# Patient Record
Sex: Male | Born: 1940 | Race: Black or African American | Hispanic: No | Smoking: Former smoker
Health system: Southern US, Community
[De-identification: ages and names within clinical notes are randomized; demographics above are authoritative.]

## PROBLEM LIST (undated history)

## (undated) DIAGNOSIS — N400 Enlarged prostate without lower urinary tract symptoms: Secondary | ICD-10-CM

## (undated) DIAGNOSIS — E119 Type 2 diabetes mellitus without complications: Secondary | ICD-10-CM

## (undated) DIAGNOSIS — R569 Unspecified convulsions: Secondary | ICD-10-CM

## (undated) DIAGNOSIS — F209 Schizophrenia, unspecified: Secondary | ICD-10-CM

## (undated) DIAGNOSIS — I639 Cerebral infarction, unspecified: Secondary | ICD-10-CM

## (undated) DIAGNOSIS — F319 Bipolar disorder, unspecified: Secondary | ICD-10-CM

## (undated) DIAGNOSIS — F0281 Dementia in other diseases classified elsewhere with behavioral disturbance: Secondary | ICD-10-CM

## (undated) DIAGNOSIS — H409 Unspecified glaucoma: Secondary | ICD-10-CM

## (undated) DIAGNOSIS — I10 Essential (primary) hypertension: Secondary | ICD-10-CM

## (undated) DIAGNOSIS — E7800 Pure hypercholesterolemia, unspecified: Secondary | ICD-10-CM

## (undated) DIAGNOSIS — F329 Major depressive disorder, single episode, unspecified: Secondary | ICD-10-CM

## (undated) DIAGNOSIS — F419 Anxiety disorder, unspecified: Secondary | ICD-10-CM

## (undated) DIAGNOSIS — G4700 Insomnia, unspecified: Secondary | ICD-10-CM

## (undated) DIAGNOSIS — G309 Alzheimer's disease, unspecified: Secondary | ICD-10-CM

## (undated) DIAGNOSIS — F32A Depression, unspecified: Secondary | ICD-10-CM

## (undated) DIAGNOSIS — F02818 Dementia in other diseases classified elsewhere, unspecified severity, with other behavioral disturbance: Secondary | ICD-10-CM

## (undated) DIAGNOSIS — I440 Atrioventricular block, first degree: Secondary | ICD-10-CM

## (undated) HISTORY — PX: EYE SURGERY: SHX253

## (undated) HISTORY — PX: GLAUCOMA SURGERY: SHX656

## (undated) HISTORY — PX: APPENDECTOMY: SHX54

## (undated) HISTORY — PX: REFRACTIVE SURGERY: SHX103

## (undated) HISTORY — PX: CHOLECYSTECTOMY: SHX55

---

## 2017-03-22 ENCOUNTER — Observation Stay (HOSPITAL_COMMUNITY): Payer: Medicare Other

## 2017-03-22 ENCOUNTER — Other Ambulatory Visit: Payer: Self-pay

## 2017-03-22 ENCOUNTER — Inpatient Hospital Stay (HOSPITAL_COMMUNITY)
Admission: EM | Admit: 2017-03-22 | Discharge: 2017-03-27 | DRG: 100 | Disposition: A | Discharge status: Skilled Nursing Facility | Payer: Medicare Other | Attending: Internal Medicine | Admitting: Internal Medicine

## 2017-03-22 ENCOUNTER — Encounter (HOSPITAL_COMMUNITY): Payer: Self-pay | Admitting: Emergency Medicine

## 2017-03-22 ENCOUNTER — Emergency Department (HOSPITAL_COMMUNITY): Payer: Medicare Other

## 2017-03-22 DIAGNOSIS — G9341 Metabolic encephalopathy: Secondary | ICD-10-CM | POA: Diagnosis present

## 2017-03-22 DIAGNOSIS — G4700 Insomnia, unspecified: Secondary | ICD-10-CM

## 2017-03-22 DIAGNOSIS — H409 Unspecified glaucoma: Secondary | ICD-10-CM

## 2017-03-22 DIAGNOSIS — G9340 Encephalopathy, unspecified: Secondary | ICD-10-CM

## 2017-03-22 DIAGNOSIS — Z5309 Procedure and treatment not carried out because of other contraindication: Secondary | ICD-10-CM

## 2017-03-22 DIAGNOSIS — R4182 Altered mental status, unspecified: Secondary | ICD-10-CM

## 2017-03-22 DIAGNOSIS — G308 Other Alzheimer's disease: Secondary | ICD-10-CM | POA: Diagnosis not present

## 2017-03-22 DIAGNOSIS — F0281 Dementia in other diseases classified elsewhere with behavioral disturbance: Secondary | ICD-10-CM | POA: Diagnosis not present

## 2017-03-22 DIAGNOSIS — F02818 Dementia in other diseases classified elsewhere, unspecified severity, with other behavioral disturbance: Secondary | ICD-10-CM

## 2017-03-22 DIAGNOSIS — G40909 Epilepsy, unspecified, not intractable, without status epilepticus: Secondary | ICD-10-CM | POA: Diagnosis not present

## 2017-03-22 DIAGNOSIS — R410 Disorientation, unspecified: Secondary | ICD-10-CM

## 2017-03-22 DIAGNOSIS — I10 Essential (primary) hypertension: Secondary | ICD-10-CM

## 2017-03-22 DIAGNOSIS — R569 Unspecified convulsions: Secondary | ICD-10-CM

## 2017-03-22 DIAGNOSIS — R404 Transient alteration of awareness: Secondary | ICD-10-CM | POA: Diagnosis not present

## 2017-03-22 DIAGNOSIS — F319 Bipolar disorder, unspecified: Secondary | ICD-10-CM

## 2017-03-22 DIAGNOSIS — E119 Type 2 diabetes mellitus without complications: Secondary | ICD-10-CM

## 2017-03-22 DIAGNOSIS — G309 Alzheimer's disease, unspecified: Secondary | ICD-10-CM

## 2017-03-22 DIAGNOSIS — E785 Hyperlipidemia, unspecified: Secondary | ICD-10-CM

## 2017-03-22 HISTORY — DX: Unspecified glaucoma: H40.9

## 2017-03-22 HISTORY — DX: Atrioventricular block, first degree: I44.0

## 2017-03-22 HISTORY — DX: Essential (primary) hypertension: I10

## 2017-03-22 HISTORY — DX: Schizophrenia, unspecified: F20.9

## 2017-03-22 HISTORY — DX: Type 2 diabetes mellitus without complications: E11.9

## 2017-03-22 HISTORY — DX: Cerebral infarction, unspecified: I63.9

## 2017-03-22 HISTORY — DX: Alzheimer's disease, unspecified: G30.9

## 2017-03-22 HISTORY — DX: Major depressive disorder, single episode, unspecified: F32.9

## 2017-03-22 HISTORY — DX: Pure hypercholesterolemia, unspecified: E78.00

## 2017-03-22 HISTORY — DX: Benign prostatic hyperplasia without lower urinary tract symptoms: N40.0

## 2017-03-22 HISTORY — DX: Dementia in other diseases classified elsewhere, unspecified severity, with other behavioral disturbance: F02.818

## 2017-03-22 HISTORY — DX: Dementia in other diseases classified elsewhere with behavioral disturbance: F02.81

## 2017-03-22 HISTORY — DX: Bipolar disorder, unspecified: F31.9

## 2017-03-22 HISTORY — DX: Unspecified convulsions: R56.9

## 2017-03-22 HISTORY — DX: Anxiety disorder, unspecified: F41.9

## 2017-03-22 HISTORY — DX: Insomnia, unspecified: G47.00

## 2017-03-22 HISTORY — DX: Depression, unspecified: F32.A

## 2017-03-22 LAB — CBC
HEMATOCRIT: 38.5 % — AB (ref 39.0–52.0)
HEMOGLOBIN: 12.4 g/dL — AB (ref 13.0–17.0)
MCH: 27.1 pg (ref 26.0–34.0)
MCHC: 32.2 g/dL (ref 30.0–36.0)
MCV: 84.2 fL (ref 78.0–100.0)
Platelets: 176 10*3/uL (ref 150–400)
RBC: 4.57 MIL/uL (ref 4.22–5.81)
RDW: 14.3 % (ref 11.5–15.5)
WBC: 8.2 10*3/uL (ref 4.0–10.5)

## 2017-03-22 LAB — GLUCOSE, CAPILLARY
GLUCOSE-CAPILLARY: 104 mg/dL — AB (ref 65–99)
Glucose-Capillary: 107 mg/dL — ABNORMAL HIGH (ref 65–99)
Glucose-Capillary: 73 mg/dL (ref 65–99)
Glucose-Capillary: 103 mg/dL — ABNORMAL HIGH (ref 65–99)

## 2017-03-22 LAB — URINALYSIS, ROUTINE W REFLEX MICROSCOPIC
BILIRUBIN URINE: NEGATIVE
Bacteria, UA: NONE SEEN
GLUCOSE, UA: NEGATIVE mg/dL
Hgb urine dipstick: NEGATIVE
KETONES UR: NEGATIVE mg/dL
LEUKOCYTES UA: NEGATIVE
NITRITE: NEGATIVE
PH: 8 (ref 5.0–8.0)
Protein, ur: 100 mg/dL — AB
Specific Gravity, Urine: 1.028 (ref 1.005–1.030)
Squamous Epithelial / LPF: NONE SEEN

## 2017-03-22 LAB — I-STAT CHEM 8, ED
BUN: 20 mg/dL (ref 6–20)
CALCIUM ION: 1.07 mmol/L — AB (ref 1.15–1.40)
CREATININE: 1.3 mg/dL — AB (ref 0.61–1.24)
Chloride: 106 mmol/L (ref 101–111)
GLUCOSE: 195 mg/dL — AB (ref 65–99)
HCT: 39 % (ref 39.0–52.0)
Hemoglobin: 13.3 g/dL (ref 13.0–17.0)
Potassium: 4.3 mmol/L (ref 3.5–5.1)
Sodium: 140 mmol/L (ref 135–145)
TCO2: 19 mmol/L — AB (ref 22–32)

## 2017-03-22 LAB — I-STAT TROPONIN, ED: Troponin i, poc: 0.06 ng/mL (ref 0.00–0.08)

## 2017-03-22 LAB — COMPREHENSIVE METABOLIC PANEL
ALBUMIN: 3.5 g/dL (ref 3.5–5.0)
ALK PHOS: 73 U/L (ref 38–126)
ALT: 19 U/L (ref 17–63)
AST: 34 U/L (ref 15–41)
Anion gap: 16 — ABNORMAL HIGH (ref 5–15)
BUN: 16 mg/dL (ref 6–20)
CALCIUM: 8.7 mg/dL — AB (ref 8.9–10.3)
CO2: 17 mmol/L — ABNORMAL LOW (ref 22–32)
Chloride: 104 mmol/L (ref 101–111)
Creatinine, Ser: 1.45 mg/dL — ABNORMAL HIGH (ref 0.61–1.24)
GFR calc Af Amer: 53 mL/min — ABNORMAL LOW (ref >60)
GFR calc non Af Amer: 46 mL/min — ABNORMAL LOW (ref >60)
GLUCOSE: 195 mg/dL — AB (ref 65–99)
Potassium: 4.2 mmol/L (ref 3.5–5.1)
Sodium: 137 mmol/L (ref 135–145)
TOTAL PROTEIN: 6.3 g/dL — AB (ref 6.5–8.1)

## 2017-03-22 LAB — RAPID URINE DRUG SCREEN, HOSP PERFORMED
Amphetamines: NOT DETECTED
BARBITURATES: NOT DETECTED
Benzodiazepines: POSITIVE — AB
Cocaine: NOT DETECTED
Opiates: NOT DETECTED
TETRAHYDROCANNABINOL: NOT DETECTED

## 2017-03-22 LAB — DIFFERENTIAL
Basophils Absolute: 0 10*3/uL (ref 0.0–0.1)
Basophils Relative: 0 %
Eosinophils Absolute: 0.5 10*3/uL (ref 0.0–0.7)
Eosinophils Relative: 6 %
LYMPHS ABS: 2.2 10*3/uL (ref 0.7–4.0)
LYMPHS PCT: 26 %
MONOS PCT: 10 %
Monocytes Absolute: 0.8 10*3/uL (ref 0.1–1.0)
NEUTROS PCT: 58 %
Neutro Abs: 4.7 10*3/uL (ref 1.7–7.7)

## 2017-03-22 LAB — CBG MONITORING, ED
GLUCOSE-CAPILLARY: 114 mg/dL — AB (ref 65–99)
Glucose-Capillary: 190 mg/dL — ABNORMAL HIGH (ref 65–99)

## 2017-03-22 LAB — ETHANOL: Alcohol, Ethyl (B): <10 mg/dL (ref <10)

## 2017-03-22 LAB — HEMOGLOBIN A1C
HEMOGLOBIN A1C: 6.2 % — AB (ref 4.8–5.6)
MEAN PLASMA GLUCOSE: 131.24 mg/dL

## 2017-03-22 LAB — APTT: aPTT: 28 seconds (ref 24–36)

## 2017-03-22 LAB — PROTIME-INR
INR: 1.08
Prothrombin Time: 13.9 seconds (ref 11.4–15.2)

## 2017-03-22 LAB — MRSA PCR SCREENING: MRSA by PCR: NEGATIVE

## 2017-03-22 LAB — VALPROIC ACID LEVEL: Valproic Acid Lvl: <10 ug/mL — ABNORMAL LOW (ref 50.0–100.0)

## 2017-03-22 MED ORDER — DONEPEZIL HCL 5 MG PO TABS
5.0000 mg | ORAL_TABLET | Freq: Every day | ORAL | Status: DC
  Administered 2017-03-23 – 2017-03-26 (×4): 5 mg via ORAL
  Filled 2017-03-22 (×5): qty 1

## 2017-03-22 MED ORDER — HYDRALAZINE HCL 20 MG/ML IJ SOLN: 5.0000 mg | Freq: Three times a day (TID) | INTRAMUSCULAR | Status: DC | PRN

## 2017-03-22 MED ORDER — BRIMONIDINE TARTRATE 0.2 % OP SOLN: 1.0000 [drp] | Freq: Two times a day (BID) | OPHTHALMIC | Status: DC

## 2017-03-22 MED ORDER — ASPIRIN EC 81 MG PO TBEC
81.0000 mg | DELAYED_RELEASE_TABLET | ORAL | Status: DC
  Administered 2017-03-24 – 2017-03-27 (×4): 81 mg via ORAL
  Filled 2017-03-22 (×5): qty 1

## 2017-03-22 MED ORDER — IOPAMIDOL (ISOVUE-370) INJECTION 76%
INTRAVENOUS | Status: AC
  Filled 2017-03-22: qty 50

## 2017-03-22 MED ORDER — ATORVASTATIN CALCIUM 10 MG PO TABS
20.0000 mg | ORAL_TABLET | Freq: Every day | ORAL | Status: DC
  Administered 2017-03-23 – 2017-03-26 (×4): 20 mg via ORAL
  Filled 2017-03-22 (×4): qty 2

## 2017-03-22 MED ORDER — MIRTAZAPINE 15 MG PO TABS
15.0000 mg | ORAL_TABLET | Freq: Every day | ORAL | Status: DC
  Administered 2017-03-23 – 2017-03-26 (×4): 15 mg via ORAL
  Filled 2017-03-22 (×5): qty 1

## 2017-03-22 MED ORDER — BRIMONIDINE TARTRATE 0.15 % OP SOLN
1.0000 [drp] | Freq: Two times a day (BID) | OPHTHALMIC | Status: DC
  Administered 2017-03-22 – 2017-03-27 (×11): 1 [drp] via OPHTHALMIC
  Filled 2017-03-22: qty 5

## 2017-03-22 MED ORDER — BRINZOLAMIDE 1 % OP SUSP
1.0000 [drp] | Freq: Two times a day (BID) | OPHTHALMIC | Status: DC
  Filled 2017-03-22: qty 10

## 2017-03-22 MED ORDER — INSULIN ASPART 100 UNIT/ML ~~LOC~~ SOLN
0.0000 [IU] | Freq: Three times a day (TID) | SUBCUTANEOUS | Status: DC
  Administered 2017-03-23 – 2017-03-24 (×2): 2 [IU] via SUBCUTANEOUS
  Administered 2017-03-26 – 2017-03-27 (×3): 1 [IU] via SUBCUTANEOUS

## 2017-03-22 MED ORDER — BISACODYL 10 MG RE SUPP: 10.0000 mg | Freq: Every day | RECTAL | Status: DC | PRN

## 2017-03-22 MED ORDER — ACETAMINOPHEN 650 MG RE SUPP: 650.0000 mg | Freq: Four times a day (QID) | RECTAL | Status: DC | PRN

## 2017-03-22 MED ORDER — LEVETIRACETAM IN NACL 1000 MG/100ML IV SOLN
1000.0000 mg | Freq: Once | INTRAVENOUS | Status: AC
  Administered 2017-03-22: 1000 mg via INTRAVENOUS

## 2017-03-22 MED ORDER — LATANOPROST 0.005 % OP SOLN
1.0000 [drp] | Freq: Every day | OPHTHALMIC | Status: DC
  Administered 2017-03-22 – 2017-03-26 (×5): 1 [drp] via OPHTHALMIC
  Filled 2017-03-22: qty 2.5

## 2017-03-22 MED ORDER — HYDRALAZINE HCL 20 MG/ML IJ SOLN
5.0000 mg | Freq: Three times a day (TID) | INTRAMUSCULAR | Status: DC | PRN
  Administered 2017-03-22: 5 mg via INTRAVENOUS
  Filled 2017-03-22: qty 1

## 2017-03-22 MED ORDER — TRAZODONE HCL 50 MG PO TABS
75.0000 mg | ORAL_TABLET | Freq: Every day | ORAL | Status: DC
  Administered 2017-03-23 – 2017-03-26 (×4): 75 mg via ORAL
  Filled 2017-03-22 (×5): qty 2

## 2017-03-22 MED ORDER — FINASTERIDE 5 MG PO TABS
5.0000 mg | ORAL_TABLET | Freq: Every day | ORAL | Status: DC
  Administered 2017-03-23 – 2017-03-27 (×5): 5 mg via ORAL
  Filled 2017-03-22 (×5): qty 1

## 2017-03-22 MED ORDER — LORAZEPAM 2 MG/ML IJ SOLN: 1.0000 mg | INTRAMUSCULAR | Status: DC | PRN

## 2017-03-22 MED ORDER — ARIPIPRAZOLE 10 MG PO TABS
10.0000 mg | ORAL_TABLET | Freq: Every day | ORAL | Status: DC
  Administered 2017-03-23 – 2017-03-27 (×5): 10 mg via ORAL
  Filled 2017-03-22 (×5): qty 1

## 2017-03-22 MED ORDER — ONDANSETRON HCL 4 MG PO TABS: 4.0000 mg | ORAL_TABLET | Freq: Four times a day (QID) | ORAL | Status: DC | PRN

## 2017-03-22 MED ORDER — DEXTROSE 5 % IV SOLN
625.0000 mg | Freq: Two times a day (BID) | INTRAVENOUS | Status: DC
  Administered 2017-03-23 (×3): 625 mg via INTRAVENOUS
  Filled 2017-03-22 (×5): qty 6.25

## 2017-03-22 MED ORDER — SALINE SPRAY 0.65 % NA SOLN
2.0000 | Freq: Every day | NASAL | Status: DC
  Administered 2017-03-23 – 2017-03-27 (×5): 2 via NASAL
  Filled 2017-03-22: qty 44

## 2017-03-22 MED ORDER — IOPAMIDOL (ISOVUE-370) INJECTION 76%
INTRAVENOUS | Status: AC
  Administered 2017-03-22: 100 mL
  Filled 2017-03-22: qty 50

## 2017-03-22 MED ORDER — ASPIRIN 300 MG RE SUPP: 150.0000 mg | Freq: Every day | RECTAL | Status: DC

## 2017-03-22 MED ORDER — PANTOPRAZOLE SODIUM 40 MG IV SOLR: 40.0000 mg | INTRAVENOUS | Status: DC

## 2017-03-22 MED ORDER — ONDANSETRON HCL 4 MG/2ML IJ SOLN: 4.0000 mg | Freq: Four times a day (QID) | INTRAMUSCULAR | Status: DC | PRN

## 2017-03-22 MED ORDER — ACETAMINOPHEN 325 MG PO TABS: 650.0000 mg | ORAL_TABLET | Freq: Four times a day (QID) | ORAL | Status: DC | PRN

## 2017-03-22 MED ORDER — SODIUM CHLORIDE 0.9 % IV SOLN
750.0000 mg | Freq: Two times a day (BID) | INTRAVENOUS | Status: DC
  Administered 2017-03-22 – 2017-03-24 (×4): 750 mg via INTRAVENOUS
  Filled 2017-03-22 (×5): qty 7.5

## 2017-03-22 MED ORDER — HEPARIN SODIUM (PORCINE) 5000 UNIT/ML IJ SOLN
5000.0000 [IU] | Freq: Three times a day (TID) | INTRAMUSCULAR | Status: DC
  Administered 2017-03-22 – 2017-03-27 (×16): 5000 [IU] via SUBCUTANEOUS
  Filled 2017-03-22 (×16): qty 1

## 2017-03-22 MED ORDER — TAMSULOSIN HCL 0.4 MG PO CAPS
0.4000 mg | ORAL_CAPSULE | Freq: Every day | ORAL | Status: DC
  Administered 2017-03-23 – 2017-03-26 (×4): 0.4 mg via ORAL
  Filled 2017-03-22 (×4): qty 1

## 2017-03-22 MED ORDER — VALPROATE SODIUM 500 MG/5ML IV SOLN
500.0000 mg | Freq: Two times a day (BID) | INTRAVENOUS | Status: DC
  Administered 2017-03-22: 500 mg via INTRAVENOUS
  Filled 2017-03-22 (×2): qty 5

## 2017-03-22 MED ORDER — ATORVASTATIN CALCIUM 20 MG PO TABS: 20.0000 mg | ORAL_TABLET | Freq: Every day | ORAL | Status: DC

## 2017-03-22 MED ORDER — FLEET ENEMA 7-19 GM/118ML RE ENEM: 1.0000 | ENEMA | Freq: Once | RECTAL | Status: DC | PRN

## 2017-03-22 NOTE — ED Notes (Signed)
EEG at bedside.

## 2017-03-22 NOTE — Progress Notes (Signed)
MRI was not able to be performed do to metal ? Staple seein in DG abdomen.  EEG neg for seizures.  There was  diffuse slowing of electrocerebral activity.  This can be seen in a wide variety of encephalopathic states including those of a toxic, metabolic, or degenerative nature. Will follow Frank Key

## 2017-03-22 NOTE — ED Notes (Signed)
Attempted report 

## 2017-03-22 NOTE — ED Provider Notes (Signed)
[facility] EMERGENCY DEPARTMENT Provider Note   CSN: [id number] Arrival date & time: 03/22/17  6060   An emergency department physician performed an initial assessment on this suspected stroke patient at 0508.  History   Chief Complaint Chief Complaint  Patient presents with  . Code Stroke    HPI Frank Key is a 77 y.o. male.  HPI  This is a 77 year old male with a history of Alzheimer's, bipolar disorder, diabetes who presents as a code stroke by EMS.  Most of his history was obtained at the bedside from EMS and neurology.  Per report, patient was found on the floor with an abrasion to the head.  He was altered.  EMS noted seizure-like activity and patient was given midazolam.  Last seen normal prior to going to bed.    Level 5 caveat for altered mental status and acuity of condition  Past Medical History:  Diagnosis Date  . Alzheimer's dementia with behavioral disturbance   . Bipolar affective disorder (HCC)   . BPH (benign prostatic hyperplasia)   . Diabetes mellitus without complication (HCC)    Type 2  . First degree AV block   . Glaucoma   . Hypertension   . Insomnia     There are no active problems to display for this patient.   History reviewed. No pertinent surgical history.     Home Medications    Prior to Admission medications   Not on File    Family History No family history on file.  Social History Social History   Tobacco Use  . Smoking status: Not on file  . Smokeless tobacco: Never Used  Substance Use Topics  . Alcohol use: No    Frequency: Never  . Drug use: No     Allergies   Penicillins   Review of Systems Review of Systems  Unable to perform ROS: Acuity of condition     Physical Exam Updated Vital Signs BP (!) 143/90 (BP Location: Left Arm)   Pulse (!) 110   Temp 97.6 F (36.4 C) (Axillary)   Resp (!) 22   SpO2 95%   Physical Exam  Constitutional:  Elderly, nontoxic, somnolent, minimally  arousable  HENT:  Head: Normocephalic.  Abrasion left forehead  Eyes: Pupils are equal, round, and reactive to light.  Pupils 2 mm and minimally reactive  Cardiovascular: Normal rate, regular rhythm and normal heart sounds.  No murmur heard. Pulmonary/Chest: Effort normal and breath sounds normal. No respiratory distress. He has no wheezes.  Abdominal: Soft. There is no tenderness.  Neurological:  Somnolent, minimally arousable, does not consistently follow commands, moves and localizes right upper extremity to pain, decreased movement noted of the left upper extremity  Skin: Skin is warm and dry.  Psychiatric: He has a normal mood and affect.  Nursing note and vitals reviewed.    ED Treatments / Results  Labs (all labs ordered are listed, but only abnormal results are displayed) Labs Reviewed  CBC - Abnormal; Notable for the following components:      Result Value   Hemoglobin 12.4 (*)    HCT 38.5 (*)    All other components within normal limits  I-STAT CHEM 8, ED - Abnormal; Notable for the following components:   Creatinine, Ser 1.30 (*)    Glucose, Bld 195 (*)    Calcium, Ion 1.07 (*)    TCO2 19 (*)    All other components within normal limits  CBG MONITORING, ED - Abnormal; Notable  for the following components:   Glucose-Capillary 190 (*)    All other components within normal limits  ETHANOL  DIFFERENTIAL  PROTIME-INR  APTT  COMPREHENSIVE METABOLIC PANEL  RAPID URINE DRUG SCREEN, HOSP PERFORMED  URINALYSIS, ROUTINE W REFLEX MICROSCOPIC  VALPROIC ACID LEVEL  I-STAT TROPONIN, ED    EKG  EKG Interpretation  Date/Time:  Wednesday March 22 2017 05:53:47 EDT Ventricular Rate:  106 PR Interval:    QRS Duration: 95 QT Interval:  343 QTC Calculation: 456 R Axis:   -20 Text Interpretation:  Sinus or ectopic atrial tachycardia Borderline left axis deviation Anteroseptal infarct, old Confirmed by Ross Marcus ([id number]) on 03/22/2017 6:00:26 AM        Radiology Ct Head Code Stroke Wo Contrast  Result Date: 03/22/2017 CLINICAL DATA:  Code stroke. Initial evaluation for acute fall, seizure activity. EXAM: CT HEAD WITHOUT CONTRAST TECHNIQUE: Contiguous axial images were obtained from the base of the skull through the vertex without intravenous contrast. COMPARISON:  None. FINDINGS: Brain: Generalized age-related cerebral atrophy. Moderate chronic small vessel ischemic disease. No acute intracranial hemorrhage. No acute large vessel territory infarct. No mass lesion, midline shift or mass effect. Diffuse ventricular prominence related global parenchymal volume loss of hydrocephalus. No extra-axial fluid collection. Vascular: No asymmetric hyperdense vessel. Scattered vascular calcifications noted within the carotid siphons. Skull: Scalp soft tissues and calvarium within normal limits. Remote posttraumatic defect noted at the right zygomatic arch. Sinuses/Orbits: Globes and orbital soft tissues within normal limits. Patient status post lens extraction bilaterally. Scattered mucosal thickening within the maxillary sinuses and ethmoidal air cells. Paranasal sinuses are otherwise clear. No mastoid effusion. Other: None. ASPECTS [facility] Stroke Program Early CT Score) - Ganglionic level infarction (caudate, lentiform nuclei, internal capsule, insula, M1-M3 cortex): 7 - Supraganglionic infarction (M4-M6 cortex): 3 Total score (0-10 with 10 being normal): 10 IMPRESSION: 1. No acute intracranial abnormality identified. 2. ASPECTS is 10. 3. Moderate cerebral atrophy with chronic small vessel ischemic disease. These results were communicated to [facility] at 5:36 amon 3/20/2019by text page via the [facility] messaging system. Electronically Signed   By: Rise Mu M.D.   On: 03/22/2017 05:39    Procedures Procedures (including critical care time)  Medications Ordered in ED Medications  iopamidol (ISOVUE-370) 76 % injection (not administered)   levETIRAcetam (KEPPRA) IVPB 1000 mg/100 mL premix (1,000 mg Intravenous New Bag/Given 03/22/17 0605)  valproate (DEPACON) 500 mg in dextrose 5 % 50 mL IVPB (not administered)  iopamidol (ISOVUE-370) 76 % injection (100 mLs  Contrast Given 03/22/17 0530)     Initial Impression / Assessment and Plan / ED Course  I have reviewed the triage vital signs and the nursing notes.  Pertinent labs & imaging results that were available during my care of the patient were reviewed by me and considered in my medical decision making (see chart for details).     Patient presents with altered mental status and seizure-like activity.  Last seen normal prior to going to bed.  He initially appeared to have some decreased movement left upper extremity.  He is somnolent.  Airway guarded at this time.  He is also status post Midazolam.  He was evaluated by neurology at the bedside.  His initial CTA showed some basilar stenosis.  However, given the history of seizure-like activity, patient could also be postictal.  After initial evaluation, patient did have some improvement of the exam and mental status.  This is more suggestive of postictal state.  He is now moving his left  upper extremity better.  Neurology recommends therapeutic Depakote for seizure control, EEG, and MRI.  Plan for admission.  Final Clinical Impressions(s) / ED Diagnoses   Final diagnoses:  Disorientation  Seizure-like activity [facility])    ED Discharge Orders    None       Wilkie Aye, Mayer Masker, MD 03/22/17 [phone]

## 2017-03-22 NOTE — Code Documentation (Signed)
Responded to code stroke.  Pt arrived via [facility] after being found down at nursing home with an abrasion to the forehead.  Unclear when patient was last seen normal.  EMS reported possible seizure activity enroute to hospital and administered 5mg  midazolam.  On arrival to ED, pt was minimally responsive.  CT head, CT angio and CT perfusion were conducted.  VS were stable.    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  Pt did become more responsive and was able to follow some commands but remained lethargic.  Initial NIHSS scored as 13.  Pt to be admitted for further workup.

## 2017-03-22 NOTE — ED Provider Notes (Signed)
MSE was initiated and I personally evaluated the patient and placed orders (if any) at  5:10 AM on March 22, 2017.  The patient appears stable so that the remainder of the MSE may be completed by another provider.  Patient presented as a code stroke.  Apparently found having fallen with an abrasion on his forehead.  Some tremor was noted.  EMS reports level of consciousness decreasing during transport and seizure during transport.  No report of focal findings.  He is taken straight back to CT.  Concern for traumatic brain injury.  No lateralizing findings on preliminary exam.   Dione Booze, MD 03/22/17 [phone]

## 2017-03-22 NOTE — ED Notes (Signed)
BIB EMS from [facility] as Code Stroke. Unknown LKW, sometime last night when pt went to bed. Found this AM on floor around 0430, assumed pt had a fall since there is an abrasion to L forehead. Pt had "tremoring" while waiting for EMS to arrive, pt then had seizure once with EMS. Pt was given 5 Versed IM. Upon arrival to ED GCS 6, pt does not follow any commands, snoring RR noted. Pt taken immediately to CT.

## 2017-03-22 NOTE — Progress Notes (Signed)
Same day progress note  Pt seen earlier in AM.  New info in addition to Dr. Alene Mires consult note: Reports he has h/o sz and was taken off of AEDs. Could not tell me reason. H&P also reports h/o bipolar d/o  Subjective: Also noted to have bradycardia with HR in 30s today.  Objective: Vitals:   03/22/17 1130 03/22/17 1500  BP: (!) 172/101 (!) 149/88  Pulse: 68 67  Resp: 14 15  Temp: 97.7 F (36.5 C) 97.8 F (36.6 C)  SpO2: 100% 100%    AAOx2 - could not tell me where he was at the time. Speech clear Naming, comprehension, repetition intact - albeit slow. Pupils equal round reactive to light, extraocular movements intact, face symmetric, auditory acuity grossly intact.  Shoulder shrug normal.  Tongue midline. Motor exam: Nearly symmetric strength with mild left hemiparesis. Sensory: Intact light touch all over Coordination: Intact based on reach-did not cooperate with full testing as he was getting patient care done at the time  Imaging: CT head- no acute changes.  CT perfusion-negative.  CT angios-basilar stenosis. MRI-DWI images only.  No stroke. Was not cooperative for MRI. HAS NO CONTRAINDICATION FOR MRI AS LISTED SOMEWHERE IN CHART  EEG: slowing, no sz.  Assessment: Possible seizure in the setting of not being on AEDs in a pt with known h/o seizure and also AD with behavioral disturbance. Seemed to be at his baseline on exam but not a great historian. Another possibility is vertebrobasilar insufficiency in the setting of bradycardia which was noted on telemetry.  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 -If he was compliant with meds and had a sz on it, can increase Keppra to 1g BID but with Depakote level <10 looks like was non-compliant to meds. -ASA and Statin for stroke prevention -No further imaging needed. No stroke w/u  needed. -Cardiac w/u for bradycardia per primary team -Correct toxic/metabolic derangements -Will need OP neurology f/u  Neurology will be available as needed.  Please call with questions.  -- Milon Dikes, MD [facility] Pager: [phone]  No charge note

## 2017-03-22 NOTE — ED Notes (Signed)
Admitting at bedside 

## 2017-03-22 NOTE — Procedures (Signed)
TECHNICAL SUMMARY:  A multichannel referential and bipolar montage EEG using the standard international 10-20 system was performed on the patient described as awake and asleep.  Many of the tracing are affected by muscle/myogenic artifact, making this a limited EEG.  Myogenic artifact is more prominent in the right hemisphere.  5-6 Hz occipital dominant rhythm is noted.  Low voltage fast (beta) activity is distributed symmetrically and maximally over the anterior head regions.  ACTIVATION:  Stepwise photic stimulation and hyperventilation are not performed.  EPILEPTIFORM ACTIVITY:  There were no clear spikes, sharp waves or paroxysmal activity.  SLEEP:  stage I and II sleep are noted  IMPRESSION:  This is an abnormal, albeit limited, EEG.  There was  diffuse slowing of electrocerebral activity.  This can be seen in a wide variety of encephalopathic states including those of a toxic, metabolic, or degenerative nature.  Medication can also be an etiology.  EEG is limited due to the fact that much of the tracing is affected by muscle/myogenic artifact.  No obvious epileptiform activity was noted.  Correlate with clinical examination.

## 2017-03-22 NOTE — Progress Notes (Signed)
Paged IM now  Winfrey with previous info

## 2017-03-22 NOTE — Progress Notes (Signed)
Patient having episodes of bradycardia in 30-40s and irregular heart rhythm.  BP elevated. Pt asymptomatic. MD notified.

## 2017-03-22 NOTE — Progress Notes (Signed)
Patient taken for MRI

## 2017-03-22 NOTE — Progress Notes (Signed)
Bedside EEG completed, results pending. 

## 2017-03-22 NOTE — Consult Note (Signed)
Neurology Consultation Reason for Consult: Seizure Referring Physician: Horton, C  CC: Seizure  History is obtained from:patient  HPI: Shephard Gelman is a 77 y.o. male with a  History of dementia, BPAD who presents with new onset AMS/reported seizure.   He was found down at his nursing home. Found on floor, very confused. He had been seen in bed about 30 minutes before. That asleep.   At baseline, he is high functioning, requiring  assisatance because he is "exit seeking." He needs his meals cooked, but otherwise is high functioning.   LKW: unclear tpa given?: no, unclear time of onset.  Premorbid modified rankin scale: 3  ROS: Unable to obtain due to altered mental status.   PMH: BPAD Alzheimers HTN DMII AV block, 1st degree  FHx: Unable to assess secondary to patient's altered mental status.    Social History: Unable to assess secondary to patient's altered mental status.   Exam: Current vital signs: Vitals:   03/22/17 0615 03/22/17 0630  BP: (!) 161/82 (!) 143/79  Pulse: 91 87  Resp: (!) 31 (!) 24  Temp:    SpO2: 95% 96%    Vital signs in last 24 hours:     Physical Exam  Constitutional: Appears elderly Psych: does not respond Eyes: No scleral injection HENT: No OP obstrucion Head: Normocephalic.  Cardiovascular: Normal rate and regular rhythm.  Respiratory: Effort normal, non-labored breathing GI: Soft.  No distension. There is no tenderness.  Skin: WDI  Neuro: Mental Status: Patient is lethargic, but does arouse and follow commands.   Cranial Nerves: II: blinks to threat bilaterally. Pupils are pinpoint but reactive. .   III,IV, VI: EOMI without ptosis or diploplia.  V: VII: Blinks to eyelid stimulation bilaterally VIII, X, XI, XII: Unable to assess secondary to patient's altered mental status.  Motor: Tone is normal. Bulk is normal.  Initially, he does not move his left arm as well as his right, however he does lift against gravity  subsequently. Sensory: He responds to noxious stimulation in all 4 extremities Deep Tendon Reflexes: 2+ and symmetric in the  patellae.  Cerebellar: Does not perform  I have reviewed labs in epic and the results pertinent to this consultation are: Creatinine 1.3  I have reviewed the images obtained:  CT perfusion-negative CT angios- basilar stenosis  Impression: 77 year old male presenting with new onset "seizures."  He received midozalam and Keppra and is improving which I think argues against the basilar disease being the etiology. He has Alzheimer's and this can be a source for seizures. No signs of infectious etiology.  Recommendations: 1) depakote 500 milligrams twice daily 2) EEG 3) MRI brain   Ritta Slot, MD [facility] [phone]  If 7pm- 7am, please page neurology on call as listed in AMION.

## 2017-03-22 NOTE — Progress Notes (Signed)
Paged K. Schorr Tele called Pt HR went down to 39. Monitor said A.fib but Tele says it was 2nd degree Type 2 with extra  Beats. Reported off with 1st degree with missing  Beats. Will do a 12 lead EKG.

## 2017-03-22 NOTE — Progress Notes (Signed)
Pt arrived to unit

## 2017-03-22 NOTE — ED Notes (Signed)
Neurology at bedside, pt now able to follow some commands and answer questions.

## 2017-03-22 NOTE — Progress Notes (Signed)
Patient returned from MRI/ xray

## 2017-03-22 NOTE — H&P (Addendum)
History and Physical    Frank Key ZOX:[id number] DOB: [birth year] DOA: 03/22/2017   PCP: Ron Parker, MD   Patient coming from:   Nursing Home    Chief Complaint: "Disorientation"  HPI: Frank Key is a 77 y.o. male with medical history significant for Alzheimer's dementia, bipolar disorder, diabetes, glaucoma, BPH, hypercholesterolemia, hypertension, history of seizures, resident of a nursing facility brought to the ED for evaluation of altered mental status and seizure-like activity.  Last seen normal prior to going to bed, he initially appeared to have decreased movement in the left upper extremity, was somnolent, Pain after taking Midazolam.He was found on the floor around 4:30 in the morning, and assuming that the patient had a fall due to abrasion in the left forehead, in showing tremors, he was sent to the emergency department for further evaluation.  On transport, the patient was given 5 of Versed IM.  Initially, GCS was 13, then upon arrival, his GCS was 6, but was not following any commands.  Of note, the patient was placed on a c-collar to preserve airway  He received Depakote for seizure control, and films were obtained.  Initial CTA showed some bibasilar stenosis, CT of the head was negative for acute findings.  At this time, EEG, and MRI, pending for further evaluation.  No TPA was given, as the time of onset was unclear.  He is now feeling better and is more interactive. Denies fevers, chills, night sweats, vision changes, or mucositis. Denies any respiratory complaints. Denies any chest pain or palpitations. Denies worsening lower extremity swelling. Denies nausea, heartburn or change in bowel habits. Denies abdominal pain. Appetite is normal. Denies any dysuria. Denies abnormal skin rashes.     ED Course:  BP (!) 155/79   Pulse 75   Temp 97.6 F (36.4 C) (Axillary)   Resp 15   SpO2 95%    Keppra 1000 mg IV  Valproate 500cc ordered Labs showed creatinine 1.3, glucose 195,  sodium 140, potassium 4.3, hemoglobin 13.3, white count 8.2 Urinalysis negative CT of the head negative for acute findings CT shows anterior basilar stenosis EKG Sinus or ectopic atrial tachycardia Borderline left axis deviation Anteroseptal infarct, old  MRI And EEG were recommended by [facility]   Review of Systems: See HPI, all other systems reviewed and were negative   Past Medical History:  Diagnosis Date  . Alzheimer's dementia with behavioral disturbance   . Bipolar affective disorder (HCC)   . BPH (benign prostatic hyperplasia)   . Diabetes mellitus without complication (HCC)    Type 2  . First degree AV block   . Glaucoma   . High cholesterol   . Hypertension   . Insomnia   . Seizures (HCC)     History reviewed. No pertinent surgical history.  Social History Social History   Socioeconomic History  . Marital status: Unknown    Spouse name: Not on file  . Number of children: Not on file  . Years of education: Not on file  . Highest education level: Not on file  Social Needs  . Financial resource strain: Not on file  . Food insecurity - worry: Not on file  . Food insecurity - inability: Not on file  . Transportation needs - medical: Not on file  . Transportation needs - non-medical: Not on file  Occupational History  . Not on file  Tobacco Use  . Smoking status: Not on file  . Smokeless tobacco: Never Used  Substance and Sexual Activity  .  Alcohol use: No    Frequency: Never  . Drug use: No  . Sexual activity: Not on file  Other Topics Concern  . Not on file  Social History Narrative  . Not on file     Allergies  Allergen Reactions  . Penicillins Other (See Comments)    Unknown Rxn     No family history on file.    Prior to Admission medications   Medication Sig Start Date End Date Taking? Authorizing Provider  acetaminophen (TYLENOL) 325 MG tablet Take 650 mg by mouth 2 (two) times daily.   Yes [provider]  acetaminophen (TYLENOL)  500 MG tablet Take 500 mg by mouth every 4 (four) hours as needed for mild pain.   Yes [provider]  amLODipine (NORVASC) 10 MG tablet Take 10 mg by mouth daily.   Yes [provider]  ARIPiprazole (ABILIFY) 10 MG tablet Take 10 mg by mouth daily.   Yes [provider]  aspirin EC 81 MG tablet Take 81 mg by mouth every morning.   Yes [provider]  atorvastatin (LIPITOR) 20 MG tablet Take 20 mg by mouth daily.   Yes [provider]  bisacodyl (DULCOLAX) 10 MG suppository Place 10 mg rectally daily as needed for moderate constipation.   Yes [provider]  brimonidine (ALPHAGAN P) 0.1 % SOLN Place 1 drop into both eyes 2 (two) times daily.   Yes [provider]  brimonidine (ALPHAGAN) 0.2 % ophthalmic solution Place 1 drop into the right eye 2 (two) times daily.   Yes [provider]  brinzolamide (AZOPT) 1 % ophthalmic suspension Place 1 drop into both eyes 2 (two) times daily.   Yes [provider]  cholecalciferol (VITAMIN D) 1000 units tablet Take 1,000 Units by mouth daily.   Yes [provider]  donepezil (ARICEPT) 5 MG tablet Take 5 mg by mouth at bedtime.   Yes [provider]  finasteride (PROSCAR) 5 MG tablet Take 5 mg by mouth daily.   Yes [provider]  guaifenesin (ROBITUSSIN) 100 MG/5ML syrup Take 200 mg by mouth every 6 (six) hours as needed for cough.   Yes [provider]  levETIRAcetam (KEPPRA) 100 MG/ML solution Take 750 mg by mouth 2 (two) times daily.   Yes [provider]  losartan (COZAAR) 50 MG tablet Take 100 mg by mouth daily.   Yes [provider]  magnesium hydroxide (MILK OF MAGNESIA) 400 MG/5ML suspension Take 30 mLs by mouth at bedtime as needed for mild constipation.   Yes [provider]  Melatonin 3 MG TABS Take 6 mg by mouth at bedtime.   Yes [provider]  mirtazapine (REMERON) 15 MG tablet Take 15 mg by  mouth at bedtime.   Yes [provider]  Multiple Vitamin (MULTIVITAMIN WITH MINERALS) TABS tablet Take 1 tablet by mouth daily.   Yes [provider]  neomycin-bacitracin-polymyxin (NEOSPORIN) OINT Apply 1 application topically daily as needed for irritation or wound care.   Yes [provider]  polyethylene glycol (MIRALAX / GLYCOLAX) packet Take 17 g by mouth daily.   Yes [provider]  senna-docusate (SENOKOT-S) 8.6-50 MG tablet Take 1-2 tablets by mouth 2 (two) times daily. 1 tablet in the morning and 2 tablets in the evening   Yes [provider]  Skin Protectants, Misc. (MINERIN) CREA Apply 1 application topically at bedtime.   Yes [provider]  sodium chloride (OCEAN) 0.65 % SOLN  nasal spray Place 2 sprays into both nostrils daily.   Yes [provider]  tamsulosin (FLOMAX) 0.4 MG CAPS capsule Take 0.4 mg by mouth daily after supper.   Yes [provider]  Travoprost, BAK Free, (TRAVATAN) 0.004 % SOLN ophthalmic solution Place 1 drop into both eyes at bedtime.   Yes [provider]  traZODone (DESYREL) 150 MG tablet Take 75 mg by mouth at bedtime.   Yes [provider]  Valproate Sodium (DEPAKENE) 250 MG/5ML SOLN solution Take 625 mg by mouth 2 (two) times daily.   Yes [provider]    Physical Exam:  Vitals:   03/22/17 0615 03/22/17 0630 03/22/17 0645 03/22/17 0700  BP: (!) 161/82 (!) 143/79 (!) 152/79 (!) 155/79  Pulse: 91 87 79 75  Resp: (!) 31 (!) 24 13 15   Temp:      TempSrc:      SpO2: 95% 96% 94% 95%   Constitutional: NAD, now arrousable, back to his baseline level of interaction  Eyes: PERRL, lids and conjunctivae normal ENMT: Mucous membranes are moist, without exudate or lesions  Neck: supple, no tenderness, no adenopathy palpable  Respiratory: clear to auscultation bilaterally, no wheezing, no crackles. Normal respiratory effort  Cardiovascular: Regular rate and  rhythm, 2 out of 6 murmur, rubs or gallops. Trace of lower extremity edema. 2+ pedal pulses. No carotid bruits.  Abdomen: Soft, non tender, No hepatosplenomegaly. Bowel sounds positive.  Musculoskeletal: no clubbing / cyanosis. Moves all extremities Skin: no jaundice, No lesions.  Neurologic.  Sensation intact, strength normal, more interactive, follows simple commands appropiately, \knows place and oriented to person, unable to tell date     Labs on Admission: I have personally reviewed following labs and imaging studies  CBC: Recent Labs  Lab 03/22/17 0508 03/22/17 0533  WBC 8.2  --   NEUTROABS 4.7  --   HGB 12.4* 13.3  HCT 38.5* 39.0  MCV 84.2  --   PLT 176  --     Basic Metabolic Panel: Recent Labs  Lab 03/22/17 0508 03/22/17 0533  NA 137 140  K 4.2 4.3  CL 104 106  CO2 17*  --   GLUCOSE 195* 195*  BUN 16 20  CREATININE 1.45* 1.30*  CALCIUM 8.7*  --     GFR: CrCl cannot be calculated (Unknown ideal weight.).  Liver Function Tests: Recent Labs  Lab 03/22/17 0508  AST 34  ALT 19  ALKPHOS 73  BILITOT <0.1*  PROT 6.3*  ALBUMIN 3.5   No results for input(s): LIPASE, AMYLASE in the last 168 hours. No results for input(s): AMMONIA in the last 168 hours.  Coagulation Profile: Recent Labs  Lab 03/22/17 0508  INR 1.08    Cardiac Enzymes: No results for input(s): CKTOTAL, CKMB, CKMBINDEX, TROPONINI in the last 168 hours.  BNP (last 3 results) No results for input(s): PROBNP in the last 8760 hours.  HbA1C: No results for input(s): HGBA1C in the last 72 hours.  CBG: Recent Labs  Lab 03/22/17 0511  GLUCAP 190*    Lipid Profile: No results for input(s): CHOL, HDL, LDLCALC, TRIG, CHOLHDL, LDLDIRECT in the last 72 hours.  Thyroid Function Tests: No results for input(s): TSH, T4TOTAL, FREET4, T3FREE, THYROIDAB in the last 72 hours.  Anemia Panel: No results for input(s): VITAMINB12, FOLATE, FERRITIN, TIBC, IRON, RETICCTPCT in the last 72  hours.  Urine analysis: No results found for: COLORURINE, APPEARANCEUR, LABSPEC, PHURINE, GLUCOSEU, HGBUR, BILIRUBINUR, KETONESUR, PROTEINUR, UROBILINOGEN, NITRITE, LEUKOCYTESUR  Sepsis  Labs: @LABRCNTIP (procalcitonin:4,lacticidven:4) )No results found for this or any previous visit (from the past 240 hour(s)).   Radiological Exams on Admission: Ct Angio Head W Or Wo Contrast  Result Date: 03/22/2017 EXAM: CT ANGIOGRAPHY HEAD AND NECK CT PERFUSION BRAIN TECHNIQUE: Multidetector CT imaging of the head and neck was performed using the standard protocol during bolus administration of intravenous contrast. Multiplanar CT image reconstructions and MIPs were obtained to evaluate the vascular anatomy. Carotid stenosis measurements (when applicable) are obtained utilizing NASCET criteria, using the distal internal carotid diameter as the denominator. Multiphase CT imaging of the brain was performed following IV bolus contrast injection. Subsequent parametric perfusion maps were calculated using RAPID software. CONTRAST:  ISOVUE-370 IOPAMIDOL (ISOVUE-370) INJECTION 76% COMPARISON:  None. FINDINGS: CTA NECK FINDINGS Aortic arch: Visualized aortic arch of normal caliber with normal branch pattern. No high-grade stenosis about the origin of the great vessels. Visualized subclavian arteries widely patent. Right carotid system: Right common carotid artery demonstrates scattered atheromatous irregularity without flow-limiting stenosis. There is a severe near occlusive stenosis involving the proximal right ICA (series 7, image 206). String sign present. Stenosis measures approximately 4 mm in length. Right ICA patent distally to the skull base without additional stenosis, dissection, or occlusion. Left carotid system: Left common carotid artery tortuous proximally. A centric plaque within the mid-distal left common carotid artery without significant stenosis. Calcified plaque about the left carotid  bifurcation/proximal left ICA associated stenosis of up to approximately 50% by NASCET criteria. Left ICA patent distally to the skull base without additional stenosis, dissection, or occlusion. Vertebral arteries: Both of the vertebral arteries arise from the subclavian arteries. Right vertebral artery occluded at its origin. Left vertebral artery patent within the neck without stenosis, dissection, or occlusion. Skeleton: Mild exaggeration of the normal cervical lordosis. Trace retrolisthesis of C5 on C6. No other listhesis or malalignment. Skull base intact. Normal C1-2 articulations are preserved in the dens is intact. Vertebral body heights maintained. No acute fracture. No abnormal prevertebral edema. Mild to moderate degenerate spondylolysis present at C3-4 through C6-7. Other neck: Soft tissues of the neck demonstrate no other acute abnormality. Salivary glands within normal limits. Thyroid normal. No adenopathy. Upper chest: Visualized upper chest demonstrates no acute abnormality. Partially visualized lungs are grossly clear. Review of the MIP images confirms the above findings CTA HEAD FINDINGS Anterior circulation: Petrous right ICA patent proximally. There is a focal severe stenosis at the distal petrous/cavernous junction on the right (series 7, image 121). Extensive atheromatous disease throughout the cavernous/supraclinoid right ICA with moderate to severe diffuse narrowing. Right ICA is patent to the terminus. On the left, the petrous left ICA patent without high-grade stenosis. Extensive atheromatous plaque throughout the cavernous/supraclinoid left ICA with moderate to severe narrowing as well. Left ICA terminus patent. Multifocal moderate to severe stenoses present within the A1 segments bilaterally. Patent anterior communicating artery. Anterior cerebral arteries demonstrate extensive atheromatous irregularity with multifocal severe stenoses, right worse than left, but are patent to their distal  aspects. Multifocal moderate to severe proximal and mid right M1 stenoses (series 7, image 92, 90). There are additional multifocal severe proximal right M2 stenoses. Right MCA branches are perfused distally, although demonstrate extensive atheromatous irregularity. Diffuse atheromatous irregularity throughout the left M1 segment with more mild to moderate multifocal narrowing at the mid aspect. Extensive atheromatous irregularity throughout the left MCA branches, which are perfused to their distal aspects. Posterior circulation: Extensive atheromatous irregularity with moderate to severe stenoses involving the mid and distal left  V4 segment. Left PICA is patent. Right vertebral artery occluded. There is severe near occlusive stenosis at the vertebrobasilar junction/proximal basilar artery (series 7, image 135). Scant thready flow seen distally within the basilar artery with multifocal moderate to severe stenoses. Basilar is patent to its distal aspect. Superior cerebral arteries patent bilaterally. Right PCA supplied via the basilar. Predominant fetal type origin of the left PCA supplied via a patent and irregular left P com. Extensive atheromatous irregularity throughout the PCAs bilaterally with multifocal severe stenoses. PCAs are patent to their distal aspects. Venous sinuses: Grossly patent, although not well evaluated due to arterial timing of the contrast bolus Anatomic variants: Fetal type origin of the left PCA with underlying diminutive vertebrobasilar system. No aneurysm. Delayed phase: Not performed. Review of the MIP images confirms the above findings CT Brain Perfusion Findings: CBF (<30%) Volume: 0mL Perfusion (Tmax>6.0s) volume: 0mL Mismatch Volume: 0mL Infarction Location:No cerebral infarct by perfusion. Mildly elevated mean transit time in T-max within the right cerebral hemisphere most likely related to noted right ICA stenosis. IMPRESSION: 1. Negative CTA for emergent large vessel occlusion. No  evidence for acute infarct by CT perfusion. 2. 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 3. 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 4. 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 Findings are associated with near occlusive stenosis at the proximal basilar artery, with markedly attenuated with irregular thready flow distally. 5. No acute abnormality within the cervical spine.  No fracture. Critical Value/emergent results were called by telephone at the time of interpretation on 03/22/2017 at 5:50 am to Dr. Ross Marcus ; [facility] , who verbally acknowledged these results. Electronically Signed   By: Rise Mu M.D.   On: 03/22/2017 06:36   Ct Angio Neck W And/or Wo Contrast  Result Date: 03/22/2017 EXAM: CT ANGIOGRAPHY HEAD AND NECK CT PERFUSION BRAIN TECHNIQUE: Multidetector CT imaging of the head and neck was performed using the standard protocol during bolus administration of intravenous contrast. Multiplanar CT image reconstructions and MIPs were obtained to evaluate the vascular anatomy. Carotid stenosis measurements (when applicable) are obtained utilizing NASCET criteria, using the distal internal carotid diameter as the denominator. Multiphase CT imaging of the brain was performed following IV bolus contrast injection. Subsequent parametric perfusion maps were calculated using RAPID software. CONTRAST:  ISOVUE-370 IOPAMIDOL (ISOVUE-370) INJECTION 76% COMPARISON:  None. FINDINGS: CTA NECK FINDINGS Aortic arch: Visualized aortic arch of normal caliber with normal branch pattern. No high-grade stenosis about the origin of the great vessels. Visualized subclavian arteries widely patent. Right carotid system: Right common carotid artery demonstrates scattered atheromatous irregularity without  flow-limiting stenosis. There is a severe near occlusive stenosis involving the proximal right ICA (series 7, image 206). String sign present. Stenosis measures approximately 4 mm in length. Right ICA patent distally to the skull base without additional stenosis, dissection, or occlusion. Left carotid system: Left common carotid artery tortuous proximally. A centric plaque within the mid-distal left common carotid artery without significant stenosis. Calcified plaque about the left carotid bifurcation/proximal left ICA associated stenosis of up to approximately 50% by NASCET criteria. Left ICA patent distally to the skull base without additional stenosis, dissection, or occlusion. Vertebral arteries: Both of the vertebral arteries arise from the subclavian arteries. Right vertebral artery occluded at its origin. Left vertebral artery patent within the neck without stenosis, dissection, or occlusion. Skeleton: Mild  exaggeration of the normal cervical lordosis. Trace retrolisthesis of C5 on C6. No other listhesis or malalignment. Skull base intact. Normal C1-2 articulations are preserved in the dens is intact. Vertebral body heights maintained. No acute fracture. No abnormal prevertebral edema. Mild to moderate degenerate spondylolysis present at C3-4 through C6-7. Other neck: Soft tissues of the neck demonstrate no other acute abnormality. Salivary glands within normal limits. Thyroid normal. No adenopathy. Upper chest: Visualized upper chest demonstrates no acute abnormality. Partially visualized lungs are grossly clear. Review of the MIP images confirms the above findings CTA HEAD FINDINGS Anterior circulation: Petrous right ICA patent proximally. There is a focal severe stenosis at the distal petrous/cavernous junction on the right (series 7, image 121). Extensive atheromatous disease throughout the cavernous/supraclinoid right ICA with moderate to severe diffuse narrowing. Right ICA is patent to the terminus. On  the left, the petrous left ICA patent without high-grade stenosis. Extensive atheromatous plaque throughout the cavernous/supraclinoid left ICA with moderate to severe narrowing as well. Left ICA terminus patent. Multifocal moderate to severe stenoses present within the A1 segments bilaterally. Patent anterior communicating artery. Anterior cerebral arteries demonstrate extensive atheromatous irregularity with multifocal severe stenoses, right worse than left, but are patent to their distal aspects. Multifocal moderate to severe proximal and mid right M1 stenoses (series 7, image 92, 90). There are additional multifocal severe proximal right M2 stenoses. Right MCA branches are perfused distally, although demonstrate extensive atheromatous irregularity. Diffuse atheromatous irregularity throughout the left M1 segment with more mild to moderate multifocal narrowing at the mid aspect. Extensive atheromatous irregularity throughout the left MCA branches, which are perfused to their distal aspects. Posterior circulation: Extensive atheromatous irregularity with moderate to severe stenoses involving the mid and distal left V4 segment. Left PICA is patent. Right vertebral artery occluded. There is severe near occlusive stenosis at the vertebrobasilar junction/proximal basilar artery (series 7, image 135). Scant thready flow seen distally within the basilar artery with multifocal moderate to severe stenoses. Basilar is patent to its distal aspect. Superior cerebral arteries patent bilaterally. Right PCA supplied via the basilar. Predominant fetal type origin of the left PCA supplied via a patent and irregular left P com. Extensive atheromatous irregularity throughout the PCAs bilaterally with multifocal severe stenoses. PCAs are patent to their distal aspects. Venous sinuses: Grossly patent, although not well evaluated due to arterial timing of the contrast bolus Anatomic variants: Fetal type origin of the left PCA with  underlying diminutive vertebrobasilar system. No aneurysm. Delayed phase: Not performed. Review of the MIP images confirms the above findings CT Brain Perfusion Findings: CBF (<30%) Volume: 0mL Perfusion (Tmax>6.0s) volume: 0mL Mismatch Volume: 0mL Infarction Location:No cerebral infarct by perfusion. Mildly elevated mean transit time in T-max within the right cerebral hemisphere most likely related to noted right ICA stenosis. IMPRESSION: 1. Negative CTA for emergent large vessel occlusion. No evidence for acute infarct by CT perfusion. 2. 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 3. 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 4. 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 Findings are associated with near occlusive stenosis at the proximal basilar artery, with markedly attenuated with irregular thready flow distally. 5. No acute abnormality within the cervical spine.  No fracture. Critical Value/emergent results were called by telephone at the time of interpretation on 03/22/2017 at 5:50 am to Dr. Ross Marcus ; [facility] , who verbally acknowledged  these results. Electronically Signed   By: Rise Mu M.D.   On: 03/22/2017 06:36   Ct Cervical Spine Wo Contrast  Result Date: 03/22/2017 EXAM: CT ANGIOGRAPHY HEAD AND NECK CT PERFUSION BRAIN TECHNIQUE: Multidetector CT imaging of the head and neck was performed using the standard protocol during bolus administration of intravenous contrast. Multiplanar CT image reconstructions and MIPs were obtained to evaluate the vascular anatomy. Carotid stenosis measurements (when applicable) are obtained utilizing NASCET criteria, using the distal internal carotid diameter as the denominator. Multiphase CT imaging of the brain was performed following IV bolus contrast  injection. Subsequent parametric perfusion maps were calculated using RAPID software. CONTRAST:  ISOVUE-370 IOPAMIDOL (ISOVUE-370) INJECTION 76% COMPARISON:  None. FINDINGS: CTA NECK FINDINGS Aortic arch: Visualized aortic arch of normal caliber with normal branch pattern. No high-grade stenosis about the origin of the great vessels. Visualized subclavian arteries widely patent. Right carotid system: Right common carotid artery demonstrates scattered atheromatous irregularity without flow-limiting stenosis. There is a severe near occlusive stenosis involving the proximal right ICA (series 7, image 206). String sign present. Stenosis measures approximately 4 mm in length. Right ICA patent distally to the skull base without additional stenosis, dissection, or occlusion. Left carotid system: Left common carotid artery tortuous proximally. A centric plaque within the mid-distal left common carotid artery without significant stenosis. Calcified plaque about the left carotid bifurcation/proximal left ICA associated stenosis of up to approximately 50% by NASCET criteria. Left ICA patent distally to the skull base without additional stenosis, dissection, or occlusion. Vertebral arteries: Both of the vertebral arteries arise from the subclavian arteries. Right vertebral artery occluded at its origin. Left vertebral artery patent within the neck without stenosis, dissection, or occlusion. Skeleton: Mild exaggeration of the normal cervical lordosis. Trace retrolisthesis of C5 on C6. No other listhesis or malalignment. Skull base intact. Normal C1-2 articulations are preserved in the dens is intact. Vertebral body heights maintained. No acute fracture. No abnormal prevertebral edema. Mild to moderate degenerate spondylolysis present at C3-4 through C6-7. Other neck: Soft tissues of the neck demonstrate no other acute abnormality. Salivary glands within normal limits. Thyroid normal. No adenopathy. Upper chest: Visualized  upper chest demonstrates no acute abnormality. Partially visualized lungs are grossly clear. Review of the MIP images confirms the above findings CTA HEAD FINDINGS Anterior circulation: Petrous right ICA patent proximally. There is a focal severe stenosis at the distal petrous/cavernous junction on the right (series 7, image 121). Extensive atheromatous disease throughout the cavernous/supraclinoid right ICA with moderate to severe diffuse narrowing. Right ICA is patent to the terminus. On the left, the petrous left ICA patent without high-grade stenosis. Extensive atheromatous plaque throughout the cavernous/supraclinoid left ICA with moderate to severe narrowing as well. Left ICA terminus patent. Multifocal moderate to severe stenoses present within the A1 segments bilaterally. Patent anterior communicating artery. Anterior cerebral arteries demonstrate extensive atheromatous irregularity with multifocal severe stenoses, right worse than left, but are patent to their distal aspects. Multifocal moderate to severe proximal and mid right M1 stenoses (series 7, image 92, 90). There are additional multifocal severe proximal right M2 stenoses. Right MCA branches are perfused distally, although demonstrate extensive atheromatous irregularity. Diffuse atheromatous irregularity throughout the left M1 segment with more mild to moderate multifocal narrowing at the mid aspect. Extensive atheromatous irregularity throughout the left MCA branches, which are perfused to their distal aspects. Posterior circulation: Extensive atheromatous irregularity with moderate to severe stenoses involving the mid and distal left V4 segment. Left PICA is patent.  Right vertebral artery occluded. There is severe near occlusive stenosis at the vertebrobasilar junction/proximal basilar artery (series 7, image 135). Scant thready flow seen distally within the basilar artery with multifocal moderate to severe stenoses. Basilar is patent to its  distal aspect. Superior cerebral arteries patent bilaterally. Right PCA supplied via the basilar. Predominant fetal type origin of the left PCA supplied via a patent and irregular left P com. Extensive atheromatous irregularity throughout the PCAs bilaterally with multifocal severe stenoses. PCAs are patent to their distal aspects. Venous sinuses: Grossly patent, although not well evaluated due to arterial timing of the contrast bolus Anatomic variants: Fetal type origin of the left PCA with underlying diminutive vertebrobasilar system. No aneurysm. Delayed phase: Not performed. Review of the MIP images confirms the above findings CT Brain Perfusion Findings: CBF (<30%) Volume: 0mL Perfusion (Tmax>6.0s) volume: 0mL Mismatch Volume: 0mL Infarction Location:No cerebral infarct by perfusion. Mildly elevated mean transit time in T-max within the right cerebral hemisphere most likely related to noted right ICA stenosis. IMPRESSION: 1. Negative CTA for emergent large vessel occlusion. No evidence for acute infarct by CT perfusion. 2. 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 3. 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 4. 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 Findings are associated with near occlusive stenosis at the proximal basilar artery, with markedly attenuated with irregular thready flow distally. 5. No acute abnormality within the cervical spine.  No fracture. Critical Value/emergent results were called by telephone at the time of interpretation on 03/22/2017 at 5:50 am to Dr. Ross Marcus ; [facility] , who verbally acknowledged these results. Electronically Signed   By: Rise Mu M.D.   On: 03/22/2017 06:36   Ct Cerebral Perfusion W Contrast  Result Date: 03/22/2017 EXAM: CT ANGIOGRAPHY HEAD AND  NECK CT PERFUSION BRAIN TECHNIQUE: Multidetector CT imaging of the head and neck was performed using the standard protocol during bolus administration of intravenous contrast. Multiplanar CT image reconstructions and MIPs were obtained to evaluate the vascular anatomy. Carotid stenosis measurements (when applicable) are obtained utilizing NASCET criteria, using the distal internal carotid diameter as the denominator. Multiphase CT imaging of the brain was performed following IV bolus contrast injection. Subsequent parametric perfusion maps were calculated using RAPID software. CONTRAST:  ISOVUE-370 IOPAMIDOL (ISOVUE-370) INJECTION 76% COMPARISON:  None. FINDINGS: CTA NECK FINDINGS Aortic arch: Visualized aortic arch of normal caliber with normal branch pattern. No high-grade stenosis about the origin of the great vessels. Visualized subclavian arteries widely patent. Right carotid system: Right common carotid artery demonstrates scattered atheromatous irregularity without flow-limiting stenosis. There is a severe near occlusive stenosis involving the proximal right ICA (series 7, image 206). String sign present. Stenosis measures approximately 4 mm in length. Right ICA patent distally to the skull base without additional stenosis, dissection, or occlusion. Left carotid system: Left common carotid artery tortuous proximally. A centric plaque within the mid-distal left common carotid artery without significant stenosis. Calcified plaque about the left carotid bifurcation/proximal left ICA associated stenosis of up to approximately 50% by NASCET criteria. Left ICA patent distally to the skull base without additional stenosis, dissection, or occlusion. Vertebral arteries: Both of the vertebral arteries arise from the subclavian arteries. Right vertebral artery occluded at its origin. Left vertebral artery patent within the neck without stenosis, dissection, or occlusion. Skeleton: Mild exaggeration of the normal  cervical lordosis. Trace  retrolisthesis of C5 on C6. No other listhesis or malalignment. Skull base intact. Normal C1-2 articulations are preserved in the dens is intact. Vertebral body heights maintained. No acute fracture. No abnormal prevertebral edema. Mild to moderate degenerate spondylolysis present at C3-4 through C6-7. Other neck: Soft tissues of the neck demonstrate no other acute abnormality. Salivary glands within normal limits. Thyroid normal. No adenopathy. Upper chest: Visualized upper chest demonstrates no acute abnormality. Partially visualized lungs are grossly clear. Review of the MIP images confirms the above findings CTA HEAD FINDINGS Anterior circulation: Petrous right ICA patent proximally. There is a focal severe stenosis at the distal petrous/cavernous junction on the right (series 7, image 121). Extensive atheromatous disease throughout the cavernous/supraclinoid right ICA with moderate to severe diffuse narrowing. Right ICA is patent to the terminus. On the left, the petrous left ICA patent without high-grade stenosis. Extensive atheromatous plaque throughout the cavernous/supraclinoid left ICA with moderate to severe narrowing as well. Left ICA terminus patent. Multifocal moderate to severe stenoses present within the A1 segments bilaterally. Patent anterior communicating artery. Anterior cerebral arteries demonstrate extensive atheromatous irregularity with multifocal severe stenoses, right worse than left, but are patent to their distal aspects. Multifocal moderate to severe proximal and mid right M1 stenoses (series 7, image 92, 90). There are additional multifocal severe proximal right M2 stenoses. Right MCA branches are perfused distally, although demonstrate extensive atheromatous irregularity. Diffuse atheromatous irregularity throughout the left M1 segment with more mild to moderate multifocal narrowing at the mid aspect. Extensive atheromatous irregularity throughout the left MCA  branches, which are perfused to their distal aspects. Posterior circulation: Extensive atheromatous irregularity with moderate to severe stenoses involving the mid and distal left V4 segment. Left PICA is patent. Right vertebral artery occluded. There is severe near occlusive stenosis at the vertebrobasilar junction/proximal basilar artery (series 7, image 135). Scant thready flow seen distally within the basilar artery with multifocal moderate to severe stenoses. Basilar is patent to its distal aspect. Superior cerebral arteries patent bilaterally. Right PCA supplied via the basilar. Predominant fetal type origin of the left PCA supplied via a patent and irregular left P com. Extensive atheromatous irregularity throughout the PCAs bilaterally with multifocal severe stenoses. PCAs are patent to their distal aspects. Venous sinuses: Grossly patent, although not well evaluated due to arterial timing of the contrast bolus Anatomic variants: Fetal type origin of the left PCA with underlying diminutive vertebrobasilar system. No aneurysm. Delayed phase: Not performed. Review of the MIP images confirms the above findings CT Brain Perfusion Findings: CBF (<30%) Volume: 0mL Perfusion (Tmax>6.0s) volume: 0mL Mismatch Volume: 0mL Infarction Location:No cerebral infarct by perfusion. Mildly elevated mean transit time in T-max within the right cerebral hemisphere most likely related to noted right ICA stenosis. IMPRESSION: 1. Negative CTA for emergent large vessel occlusion. No evidence for acute infarct by CT perfusion. 2. 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 3. 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 4. 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 Findings are associated with near occlusive stenosis at the proximal basilar artery,  with markedly attenuated with irregular thready flow distally. 5. No acute abnormality within the cervical spine.  No fracture. Critical Value/emergent results were called by telephone at the time of interpretation on 03/22/2017 at 5:50 am to Dr. Ross Marcus ; [facility] , who verbally acknowledged these results. Electronically Signed   By:  Rise Mu M.D.   On: 03/22/2017 06:36   Ct Head Code Stroke Wo Contrast  Result Date: 03/22/2017 CLINICAL DATA:  Code stroke. Initial evaluation for acute fall, seizure activity. EXAM: CT HEAD WITHOUT CONTRAST TECHNIQUE: Contiguous axial images were obtained from the base of the skull through the vertex without intravenous contrast. COMPARISON:  None. FINDINGS: Brain: Generalized age-related cerebral atrophy. Moderate chronic small vessel ischemic disease. No acute intracranial hemorrhage. No acute large vessel territory infarct. No mass lesion, midline shift or mass effect. Diffuse ventricular prominence related global parenchymal volume loss of hydrocephalus. No extra-axial fluid collection. Vascular: No asymmetric hyperdense vessel. Scattered vascular calcifications noted within the carotid siphons. Skull: Scalp soft tissues and calvarium within normal limits. Remote posttraumatic defect noted at the right zygomatic arch. Sinuses/Orbits: Globes and orbital soft tissues within normal limits. Patient status post lens extraction bilaterally. Scattered mucosal thickening within the maxillary sinuses and ethmoidal air cells. Paranasal sinuses are otherwise clear. No mastoid effusion. Other: None. ASPECTS [facility] Stroke Program Early CT Score) - Ganglionic level infarction (caudate, lentiform nuclei, internal capsule, insula, M1-M3 cortex): 7 - Supraganglionic infarction (M4-M6 cortex): 3 Total score (0-10 with 10 being normal): 10 IMPRESSION: 1. No acute intracranial abnormality identified. 2. ASPECTS is 10. 3. Moderate cerebral atrophy with chronic  small vessel ischemic disease. These results were communicated to [facility] at 5:36 amon 3/20/2019by text page via the [facility] messaging system. Electronically Signed   By: Rise Mu M.D.   On: 03/22/2017 05:39    EKG: Independently reviewed.  Assessment/Plan Principal Problem:   Altered mental status Active Problems:   Seizure (HCC)   Alzheimer's dementia with behavioral disturbance   Diabetes (HCC)   Glaucoma   Insomnia   Hypertension   Hyperlipemia   Bipolar disorder (HCC)    Acute Confusional State likely due to seizure, vs  drug induced.  CT head is negative for acute intracranial abnormalities. CTA  Head, neck and cerebral perfusion shows severe RICAS, severe vertebrobasilar and vertebrobasilar insufficiency .VSS. Labs unrevealing. Given Keppra, and he is now on Keppra BID   Afebrile. WBC, VSS. UA  and UDS pending. Initial GCS was 13, then at 6 on arrival . EKG without acute findings. Tn neg this time, EEG, and MRI ordered as instructed by Neuro pending for further evaluation.  No TPA was given, as the time of onset was unclear.  Modified Rankin is 3 Admit to Step Down Unit Seizure precautions Frequent neuro check Continue Keppra BID as per Neuro,will resume oral Keppra when OK with Neuro Await MRI (if able to be performed and no sedatives are indicated at this time)  and EEG results  Urine Drug Screen PT/OT in am  Swallow eval prior to taking POs at this time Bed rest   Vascular disease CTA  Head, neck and cerebral perfusion shows severe RICAS, severe vertebrobasilar and vertebrobasilar insufficiency. Continue to monitor, no acute intervention indicated at this time due to multiple comorbidities and advanced age, dementia COnsider follow up as OP with Vascular Surgery   Type II Diabetes Current blood sugar level is 195 No results found for: HGBA1C Hgb A1C SSI   Hypertension BP 155/79   Pulse 75  Hold BP meds until MRI neg for stroke  Add Hydralazine Q8 hours  as needed for BP 210/110   Chronic kidney disease stage  2   baseline creatinine  1.3-1.5, current Cr 1.3     Lab Results  Component Value Date   CREATININE 1.30 (H) 03/22/2017  CREATININE 1.45 (H) 03/22/2017  IVF Repeat BMET in [facility] NSAIDS    Hyperlipidemia Continue home statins  Benign prostatic hypertrophy, no acute issues Continue  Flomax and finasteride for now  Glaucoma Continue eye drops  Bipolar disorder/Dementia/ Insomnia Continue  aripiprazole, trazodone, mirtazapine   DVT prophylaxis:  Heparin sq  Code Status:    Full Family Communication:  None  Disposition Plan: Expect patient to be discharged to nursing home after condition improves Consults called:   Neuro by EDP Admission status:  Tele OBs    Marlowe Kays, PA-C [facility]   Amion text  [id number]   03/22/2017, 7:15 AM

## 2017-03-23 DIAGNOSIS — I10 Essential (primary) hypertension: Secondary | ICD-10-CM

## 2017-03-23 DIAGNOSIS — W19XXXA Unspecified fall, initial encounter: Secondary | ICD-10-CM | POA: Diagnosis present

## 2017-03-23 DIAGNOSIS — R262 Difficulty in walking, not elsewhere classified: Secondary | ICD-10-CM | POA: Diagnosis not present

## 2017-03-23 DIAGNOSIS — F319 Bipolar disorder, unspecified: Secondary | ICD-10-CM | POA: Diagnosis present

## 2017-03-23 DIAGNOSIS — G308 Other Alzheimer's disease: Secondary | ICD-10-CM

## 2017-03-23 DIAGNOSIS — Z87891 Personal history of nicotine dependence: Secondary | ICD-10-CM

## 2017-03-23 DIAGNOSIS — R402432 Glasgow coma scale score 3-8, at arrival to emergency department: Secondary | ICD-10-CM | POA: Diagnosis present

## 2017-03-23 DIAGNOSIS — E118 Type 2 diabetes mellitus with unspecified complications: Secondary | ICD-10-CM | POA: Diagnosis not present

## 2017-03-23 DIAGNOSIS — I351 Nonrheumatic aortic (valve) insufficiency: Secondary | ICD-10-CM | POA: Diagnosis not present

## 2017-03-23 DIAGNOSIS — F419 Anxiety disorder, unspecified: Secondary | ICD-10-CM | POA: Diagnosis present

## 2017-03-23 DIAGNOSIS — R269 Unspecified abnormalities of gait and mobility: Secondary | ICD-10-CM | POA: Diagnosis present

## 2017-03-23 DIAGNOSIS — T426X5A Adverse effect of other antiepileptic and sedative-hypnotic drugs, initial encounter: Secondary | ICD-10-CM | POA: Diagnosis present

## 2017-03-23 DIAGNOSIS — F05 Delirium due to known physiological condition: Secondary | ICD-10-CM | POA: Diagnosis present

## 2017-03-23 DIAGNOSIS — R001 Bradycardia, unspecified: Secondary | ICD-10-CM | POA: Diagnosis not present

## 2017-03-23 DIAGNOSIS — I129 Hypertensive chronic kidney disease with stage 1 through stage 4 chronic kidney disease, or unspecified chronic kidney disease: Secondary | ICD-10-CM | POA: Diagnosis present

## 2017-03-23 DIAGNOSIS — Z79899 Other long term (current) drug therapy: Secondary | ICD-10-CM

## 2017-03-23 DIAGNOSIS — I428 Other cardiomyopathies: Secondary | ICD-10-CM | POA: Diagnosis present

## 2017-03-23 DIAGNOSIS — Z7982 Long term (current) use of aspirin: Secondary | ICD-10-CM

## 2017-03-23 DIAGNOSIS — G40909 Epilepsy, unspecified, not intractable, without status epilepticus: Principal | ICD-10-CM | POA: Diagnosis present

## 2017-03-23 DIAGNOSIS — N400 Enlarged prostate without lower urinary tract symptoms: Secondary | ICD-10-CM | POA: Diagnosis present

## 2017-03-23 DIAGNOSIS — Z5309 Procedure and treatment not carried out because of other contraindication: Secondary | ICD-10-CM | POA: Diagnosis not present

## 2017-03-23 DIAGNOSIS — F3173 Bipolar disorder, in partial remission, most recent episode manic: Secondary | ICD-10-CM | POA: Diagnosis not present

## 2017-03-23 DIAGNOSIS — I700 Atherosclerosis of aorta: Secondary | ICD-10-CM | POA: Diagnosis present

## 2017-03-23 DIAGNOSIS — G309 Alzheimer's disease, unspecified: Secondary | ICD-10-CM | POA: Diagnosis present

## 2017-03-23 DIAGNOSIS — G4700 Insomnia, unspecified: Secondary | ICD-10-CM | POA: Diagnosis present

## 2017-03-23 DIAGNOSIS — S0081XA Abrasion of other part of head, initial encounter: Secondary | ICD-10-CM | POA: Diagnosis present

## 2017-03-23 DIAGNOSIS — G9341 Metabolic encephalopathy: Secondary | ICD-10-CM | POA: Diagnosis present

## 2017-03-23 DIAGNOSIS — R29713 NIHSS score 13: Secondary | ICD-10-CM | POA: Diagnosis present

## 2017-03-23 DIAGNOSIS — Z8673 Personal history of transient ischemic attack (TIA), and cerebral infarction without residual deficits: Secondary | ICD-10-CM

## 2017-03-23 DIAGNOSIS — G9349 Other encephalopathy: Secondary | ICD-10-CM | POA: Diagnosis present

## 2017-03-23 DIAGNOSIS — R569 Unspecified convulsions: Secondary | ICD-10-CM | POA: Diagnosis not present

## 2017-03-23 DIAGNOSIS — Z880 Allergy status to penicillin: Secondary | ICD-10-CM

## 2017-03-23 DIAGNOSIS — R404 Transient alteration of awareness: Secondary | ICD-10-CM | POA: Diagnosis not present

## 2017-03-23 DIAGNOSIS — E1122 Type 2 diabetes mellitus with diabetic chronic kidney disease: Secondary | ICD-10-CM | POA: Diagnosis present

## 2017-03-23 DIAGNOSIS — G9340 Encephalopathy, unspecified: Secondary | ICD-10-CM | POA: Diagnosis not present

## 2017-03-23 DIAGNOSIS — G450 Vertebro-basilar artery syndrome: Secondary | ICD-10-CM | POA: Diagnosis present

## 2017-03-23 DIAGNOSIS — F0281 Dementia in other diseases classified elsewhere with behavioral disturbance: Secondary | ICD-10-CM | POA: Diagnosis present

## 2017-03-23 DIAGNOSIS — Y92129 Unspecified place in nursing home as the place of occurrence of the external cause: Secondary | ICD-10-CM

## 2017-03-23 DIAGNOSIS — H409 Unspecified glaucoma: Secondary | ICD-10-CM | POA: Diagnosis present

## 2017-03-23 DIAGNOSIS — R410 Disorientation, unspecified: Secondary | ICD-10-CM | POA: Diagnosis present

## 2017-03-23 DIAGNOSIS — N182 Chronic kidney disease, stage 2 (mild): Secondary | ICD-10-CM | POA: Diagnosis present

## 2017-03-23 DIAGNOSIS — E785 Hyperlipidemia, unspecified: Secondary | ICD-10-CM | POA: Diagnosis present

## 2017-03-23 DIAGNOSIS — I441 Atrioventricular block, second degree: Secondary | ICD-10-CM | POA: Diagnosis present

## 2017-03-23 DIAGNOSIS — Z9114 Patient's other noncompliance with medication regimen: Secondary | ICD-10-CM

## 2017-03-23 LAB — BASIC METABOLIC PANEL
ANION GAP: 10 (ref 5–15)
BUN: 12 mg/dL (ref 6–20)
CALCIUM: 8.5 mg/dL — AB (ref 8.9–10.3)
CO2: 21 mmol/L — ABNORMAL LOW (ref 22–32)
Chloride: 106 mmol/L (ref 101–111)
Creatinine, Ser: 1.19 mg/dL (ref 0.61–1.24)
GFR calc non Af Amer: 58 mL/min — ABNORMAL LOW (ref >60)
Glucose, Bld: 92 mg/dL (ref 65–99)
Potassium: 5.3 mmol/L — ABNORMAL HIGH (ref 3.5–5.1)
SODIUM: 137 mmol/L (ref 135–145)

## 2017-03-23 LAB — GLUCOSE, CAPILLARY
Glucose-Capillary: 107 mg/dL — ABNORMAL HIGH (ref 65–99)
Glucose-Capillary: 154 mg/dL — ABNORMAL HIGH (ref 65–99)
Glucose-Capillary: 87 mg/dL (ref 65–99)
Glucose-Capillary: 162 mg/dL — ABNORMAL HIGH (ref 65–99)

## 2017-03-23 LAB — CBC
HCT: 37.9 % — ABNORMAL LOW (ref 39.0–52.0)
HEMOGLOBIN: 13 g/dL (ref 13.0–17.0)
MCH: 28.5 pg (ref 26.0–34.0)
MCHC: 34.3 g/dL (ref 30.0–36.0)
MCV: 83.1 fL (ref 78.0–100.0)
Platelets: 147 10*3/uL — ABNORMAL LOW (ref 150–400)
RBC: 4.56 MIL/uL (ref 4.22–5.81)
RDW: 14.8 % (ref 11.5–15.5)
WBC: 7 10*3/uL (ref 4.0–10.5)

## 2017-03-23 LAB — POTASSIUM: Potassium: 3.9 mmol/L (ref 3.5–5.1)

## 2017-03-23 MED ORDER — AMLODIPINE BESYLATE 10 MG PO TABS
10.0000 mg | ORAL_TABLET | Freq: Every day | ORAL | Status: DC
  Administered 2017-03-23 – 2017-03-27 (×5): 10 mg via ORAL
  Filled 2017-03-23 (×5): qty 1

## 2017-03-23 MED ORDER — AMLODIPINE BESYLATE 5 MG PO TABS
5.0000 mg | ORAL_TABLET | Freq: Every day | ORAL | Status: DC
  Filled 2017-03-23: qty 1

## 2017-03-23 NOTE — Clinical Social Work Note (Signed)
Clinical Social Work Assessment  Patient Details  Name: Frank Key MRN: [id number] Date of Birth: 1940-06-15  Date of referral:  03/23/17               Reason for consult:  Facility Placement                Permission sought to share information with:  Family Supports, Oceanographer granted to share information::  Yes, Verbal Permission Granted  Name::     Occupational psychologist::  ALF representatives  Relationship::  dtr  Contact Information:     Housing/Transportation Living arrangements for the past 2 months:  Assisted Living Facility Source of Information:  Patient, Adult Children Patient Interpreter Needed:  None Criminal Activity/Legal Involvement Pertinent to Current Situation/Hospitalization:  No - Comment as needed Significant Relationships:  Adult Children Lives with:  Facility Resident Do you feel safe going back to the place where you live?  No Need for family participation in patient care:  Yes (Comment)(decision making)  Care giving concerns:  Pt lives at [facility]- per dtr there are care concerns with current facility including but not limited to belief that patient is not being given his medication properly.   Social Worker assessment / plan:  CSW spoke with pt and pt dtr at bedside concerning DC plan.  Patient alert and seemed somewhat oriented at first- answered questions and was very involved in conversation but got details confused.  Most of interview was conducted pt with dtr Frank Key.  Frank Key confirms pt is current resident at [facility] but they are not satisfied with the care.  States [facility] is [facility] in [place] but they have not had much involvement in patient care.  Pt ALF stay is paid for through Medicaid/SSI Check.  Employment status:  Retired Health and safety inspector:  Armed forces operational officer, Medicaid In [place] PT Recommendations:  Not assessed at this time Information / Referral to community resources:   Skilled Nursing Facility  Patient/Family's Response to care:  Pt and dtr hopeful that pt can be transferred to another ALF since they are unsatisfied with current ALF's care.  Dtr expresses understanding that pt will likely require transfer back to [facility] while they search for alternative options.  Dtr agreeable to being contacted by facility representatives in order to find other options.  Patient/Family's Understanding of and Emotional Response to Diagnosis, Current Treatment, and Prognosis:  Pt dtr seems very knowledgeable about current situation and is motivated to get patient best level of care possible.  Emotional Assessment Appearance:  Appears stated age Attitude/Demeanor/Rapport:  Inconsistent Affect (typically observed):  Pleasant Orientation:  Oriented to Self, Oriented to  Time Alcohol / Substance use:  Not Applicable Psych involvement (Current and /or in the community):  No (Comment)  Discharge Needs  Concerns to be addressed:  Care Coordination Readmission within the last 30 days:  No Current discharge risk:  Physical Impairment Barriers to Discharge:  Continued Medical Work up   Burna Sis, LCSW 03/23/2017, 2:18 PM

## 2017-03-23 NOTE — Progress Notes (Signed)
PT took off neck  Brace. Educated and refused.

## 2017-03-23 NOTE — Consult Note (Signed)
CONSULTATION NOTE   Patient Name: Frank Key Date of Encounter: 03/23/2017 Cardiologist: No primary care provider on file.  Chief Complaint   "Wants to go to the [place] hospital"  Patient Profile   77 yo male with dementia, presents with altered mental status and possible seizure. Found to have second degree AVB on monitor.  HPI   Frank Key is a 77 y.o. male who is being seen today for the evaluation of AV block at the request of Dr. [place] Crane. 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 This gentleman has a history of Alzheimer's dementia, bipolar affective disorder, known 1st degree AVB, dyslipidemia and hypertension. He was admitted for acute encephalopathy which may have been related to seizure. He has been noted to have 1st degree AVB as well as Mobitz I and II AVB. He is completely asymptomatic with this - HR is in the 40's and 50's. He denies chest pain. An echocardiogram has been ordered, but not yet performed.  PMHx   Past Medical History:  Diagnosis Date  . Alzheimer's dementia with behavioral disturbance   . Anxiety   . Bipolar affective disorder ([place])   . BPH (benign prostatic hyperplasia)   . Depression   . First degree AV block   . Glaucoma   . High cholesterol   . Hypertension   . Insomnia   . Schizophrenia ([place])   . Seizures ([place]) ~ 06/2016; ?03/21/2017  . Stroke [facility]) ?2017  . Type 2 diabetes, diet controlled ([place])     Past Surgical History:  Procedure Laterality Date  . APPENDECTOMY    . CHOLECYSTECTOMY    . EYE SURGERY    . GLAUCOMA SURGERY Bilateral   . REFRACTIVE SURGERY Bilateral     FAMHx   Could not be obtained due to patient's dementia.  SOCHx    reports that he has quit smoking. His smoking use included cigarettes. He has never used smokeless tobacco. He reports that he has current or past drug history. Drugs: "Crack" cocaine and Marijuana. He  reports that he does not drink alcohol.  Outpatient Medications   No current facility-administered medications on file prior to encounter.    Current Outpatient Medications on File Prior to Encounter  Medication Sig Dispense Refill  . acetaminophen (TYLENOL) 325 MG tablet Take 650 mg by mouth 2 (two) times daily.    Marland Kitchen acetaminophen (TYLENOL) 500 MG tablet Take 500 mg by mouth every 4 (four) hours as needed for mild pain.    Marland Kitchen amLODipine (NORVASC) 10 MG tablet Take 10 mg by mouth daily.    . ARIPiprazole (ABILIFY) 10 MG tablet Take 10 mg by mouth daily.    Marland Kitchen aspirin EC 81 MG tablet Take 81 mg by mouth every morning.    Marland Kitchen atorvastatin (LIPITOR) 20 MG tablet Take 20 mg by mouth daily.    . bisacodyl (DULCOLAX) 10 MG suppository Place 10 mg rectally daily as needed for moderate constipation.    . brimonidine (ALPHAGAN P) 0.1 % SOLN Place 1 drop into both eyes 2 (two) times daily.    . brimonidine (ALPHAGAN) 0.2 % ophthalmic solution Place 1 drop into the right eye 2 (two) times daily.    . brinzolamide (AZOPT) 1 % ophthalmic suspension Place 1 drop into both eyes 2 (two) times daily.    Marland Kitchen donepezil (ARICEPT) 5 MG tablet Take 5 mg by  mouth at bedtime.    . finasteride (PROSCAR) 5 MG tablet Take 5 mg by mouth daily.    Marland Kitchen guaifenesin (ROBITUSSIN) 100 MG/5ML syrup Take 200 mg by mouth every 6 (six) hours as needed for cough.    . levETIRAcetam (KEPPRA) 100 MG/ML solution Take 750 mg by mouth 2 (two) times daily.    Marland Kitchen losartan (COZAAR) 50 MG tablet Take 100 mg by mouth daily.    . magnesium hydroxide (MILK OF MAGNESIA) 400 MG/5ML suspension Take 30 mLs by mouth at bedtime as needed for mild constipation.    . Melatonin 3 MG TABS Take 6 mg by mouth at bedtime.    . mirtazapine (REMERON) 15 MG tablet Take 15 mg by mouth at bedtime.    Marland Kitchen neomycin-bacitracin-polymyxin (NEOSPORIN) OINT Apply 1 application topically daily as needed for irritation or wound care.    . polyethylene glycol (MIRALAX /  GLYCOLAX) packet Take 17 g by mouth daily.    Marland Kitchen senna-docusate (SENOKOT-S) 8.6-50 MG tablet Take 1-2 tablets by mouth 2 (two) times daily. 1 tablet in the morning and 2 tablets in the evening    . Skin Protectants, Misc. (MINERIN) CREA Apply 1 application topically at bedtime.    . sodium chloride (OCEAN) 0.65 % SOLN nasal spray Place 2 sprays into both nostrils daily.    . tamsulosin (FLOMAX) 0.4 MG CAPS capsule Take 0.4 mg by mouth daily after supper.    . Travoprost, BAK Free, (TRAVATAN) 0.004 % SOLN ophthalmic solution Place 1 drop into both eyes at bedtime.    . traZODone (DESYREL) 150 MG tablet Take 75 mg by mouth at bedtime.    . Valproate Sodium (DEPAKENE) 250 MG/5ML SOLN solution Take 625 mg by mouth 2 (two) times daily.      Inpatient Medications    Scheduled Meds: . amLODipine  10 mg Oral Daily  . ARIPiprazole  10 mg Oral Daily  . aspirin EC  81 mg Oral BH-q7a  . atorvastatin  20 mg Oral q1800  . brimonidine  1 drop Both Eyes BID  . donepezil  5 mg Oral QHS  . finasteride  5 mg Oral Daily  . heparin  5,000 Units Subcutaneous Q8H  . insulin aspart  0-9 Units Subcutaneous TID WC  . latanoprost  1 drop Both Eyes QHS  . mirtazapine  15 mg Oral QHS  . sodium chloride  2 spray Each Nare Daily  . tamsulosin  0.4 mg Oral QPC supper  . traZODone  75 mg Oral QHS    Continuous Infusions: . levETIRAcetam Stopped (03/23/17 0627)  . valproate sodium 625 mg (03/23/17 0915)    PRN Meds: acetaminophen **OR** acetaminophen, bisacodyl, hydrALAZINE, LORazepam, ondansetron **OR** ondansetron (ZOFRAN) IV, sodium phosphate   ALLERGIES   Allergies  Allergen Reactions  . Penicillins Other (See Comments)    Unknown Rxn     ROS   Review of systems not obtained due to patient factors.  Vitals   Vitals:   03/23/17 0418 03/23/17 0848 03/23/17 1222 03/23/17 1643  BP: (!) 188/83 (!) 168/84 (!) 150/74 (!) 159/74  Pulse: 69 65 69 61  Resp: '18 20 20 20  '$ Temp: 99.5 F (37.5 C) 98.9  F (37.2 C) 99.2 F (37.3 C) 98 F (36.7 C)  TempSrc: Oral Oral Oral Oral  SpO2: 100% 100% 98% 98%    Intake/Output Summary (Last 24 hours) at 03/23/2017 1800 Last data filed at 03/23/2017 1736 Gross per 24 hour  Intake 343.75 ml  Output  1775 ml  Net -1431.25 ml   There were no vitals filed for this visit.  Physical Exam   General appearance: alert and no distress Neck: no carotid bruit, no JVD and thyroid not enlarged, symmetric, no tenderness/mass/nodules Lungs: clear to auscultation bilaterally Heart: regular bradycardia, 2/6 SEM at LUSB Abdomen: soft, non-tender; bowel sounds normal; no masses,  no organomegaly Extremities: extremities normal, atraumatic, no cyanosis or edema Pulses: 2+ and symmetric Skin: Skin color, texture, turgor normal. No rashes or lesions Neurologic: Mental status: alert, pleasant, follows commands Psych: Pleasant, not-agitated  Labs   Results for orders placed or performed during the hospital encounter of 03/22/17 (from the past 48 hour(s))  Ethanol     Status: None   Collection Time: 03/22/17  5:08 AM  Result Value Ref Range   Alcohol, Ethyl (B) <10 <10 mg/dL    Comment:        LOWEST DETECTABLE LIMIT FOR SERUM ALCOHOL IS 10 mg/dL FOR MEDICAL PURPOSES ONLY Performed at [facility], [address], [place], [place] [address]   Protime-INR     Status: None   Collection Time: 03/22/17  5:08 AM  Result Value Ref Range   Prothrombin Time 13.9 11.4 - 15.2 seconds   INR 1.08     Comment: Performed at [facility] [address], [place], [place] [address]  APTT     Status: None   Collection Time: 03/22/17  5:08 AM  Result Value Ref Range   aPTT 28 24 - 36 seconds    Comment: Performed at [facility] [address] [place], [place] [address]  CBC     Status: Abnormal   Collection Time: 03/22/17  5:08 AM  Result Value Ref Range   WBC 8.2 4.0 - 10.5 K/uL   RBC 4.57 4.22 - 5.81 MIL/uL   Hemoglobin 12.4 (L) 13.0 - 17.0  g/dL   HCT 38.5 (L) 39.0 - 52.0 %   MCV 84.2 78.0 - 100.0 fL   MCH 27.1 26.0 - 34.0 pg   MCHC 32.2 30.0 - 36.0 g/dL   RDW 14.3 11.5 - 15.5 %   Platelets 176 150 - 400 K/uL    Comment: Performed at [facility], [place] [address] [place], [place] [address]  Differential     Status: None   Collection Time: 03/22/17  5:08 AM  Result Value Ref Range   Neutrophils Relative % 58 %   Neutro Abs 4.7 1.7 - 7.7 K/uL   Lymphocytes Relative 26 %   Lymphs Abs 2.2 0.7 - 4.0 K/uL   Monocytes Relative 10 %   Monocytes Absolute 0.8 0.1 - 1.0 K/uL   Eosinophils Relative 6 %   Eosinophils Absolute 0.5 0.0 - 0.7 K/uL   Basophils Relative 0 %   Basophils Absolute 0.0 0.0 - 0.1 K/uL    Comment: Performed at [facility] [address] [place], [place] [address]  Comprehensive metabolic panel     Status: Abnormal   Collection Time: 03/22/17  5:08 AM  Result Value Ref Range   Sodium 137 135 - 145 mmol/L   Potassium 4.2 3.5 - 5.1 mmol/L   Chloride 104 101 - 111 mmol/L   CO2 17 (L) 22 - 32 mmol/L   Glucose, Bld 195 (H) 65 - 99 mg/dL   BUN 16 6 - 20 mg/dL   Creatinine, Ser 1.45 (H) 0.61 - 1.24 mg/dL   Calcium 8.7 (L) 8.9 - 10.3  mg/dL   Total Protein 6.3 (L) 6.5 - 8.1 g/dL   Albumin 3.5 3.5 - 5.0 g/dL   AST 34 15 - 41 U/L   ALT 19 17 - 63 U/L   Alkaline Phosphatase 73 38 - 126 U/L   Total Bilirubin <0.1 (L) 0.3 - 1.2 mg/dL   GFR calc non Af Amer 46 (L) >60 mL/min   GFR calc Af Amer 53 (L) >60 mL/min    Comment: (NOTE) The eGFR has been calculated using the CKD EPI equation. This calculation has not been validated in all clinical situations. eGFR's persistently <60 mL/min signify possible Chronic Kidney Disease.    Anion gap 16 (H) 5 - 15    Comment: Performed at [facility], [place] [address] [place], [place] [address]  Urine rapid drug screen (hosp performed)     Status: Abnormal   Collection Time: 03/22/17  5:08 AM  Result Value Ref Range   Opiates NONE DETECTED NONE  DETECTED   Cocaine NONE DETECTED NONE DETECTED   Benzodiazepines POSITIVE (A) NONE DETECTED   Amphetamines NONE DETECTED NONE DETECTED   Tetrahydrocannabinol NONE DETECTED NONE DETECTED   Barbiturates NONE DETECTED NONE DETECTED    Comment: (NOTE) DRUG SCREEN FOR MEDICAL PURPOSES ONLY.  IF CONFIRMATION IS NEEDED FOR ANY PURPOSE, NOTIFY LAB WITHIN 5 DAYS. LOWEST DETECTABLE LIMITS FOR URINE DRUG SCREEN Drug Class                     Cutoff (ng/mL) Amphetamine and metabolites    1000 Barbiturate and metabolites    200 Benzodiazepine                 314 Tricyclics and metabolites     300 Opiates and metabolites        300 Cocaine and metabolites        300 THC                            50 Performed at [facility], [place] [address], [place], [place] [address]   Urinalysis, Routine w reflex microscopic     Status: Abnormal   Collection Time: 03/22/17  5:08 AM  Result Value Ref Range   Color, Urine YELLOW YELLOW   APPearance CLEAR CLEAR   Specific Gravity, Urine 1.028 1.005 - 1.030   pH 8.0 5.0 - 8.0   Glucose, UA NEGATIVE NEGATIVE mg/dL   Hgb urine dipstick NEGATIVE NEGATIVE   Bilirubin Urine NEGATIVE NEGATIVE   Ketones, ur NEGATIVE NEGATIVE mg/dL   Protein, ur 100 (A) NEGATIVE mg/dL   Nitrite NEGATIVE NEGATIVE   Leukocytes, UA NEGATIVE NEGATIVE   RBC / HPF 0-5 0 - 5 RBC/hpf   WBC, UA 0-5 0 - 5 WBC/hpf   Bacteria, UA NONE SEEN NONE SEEN   Squamous Epithelial / LPF NONE SEEN NONE SEEN    Comment: Performed at [facility], [place] [address], [place], [place] [address]  Valproic acid level     Status: Abnormal   Collection Time: 03/22/17  5:08 AM  Result Value Ref Range   Valproic Acid Lvl <10 (L) 50.0 - 100.0 ug/mL    Comment: RESULTS CONFIRMED BY MANUAL DILUTION Performed at [facility] [address], [place], [place] [address]   CBG monitoring, ED     Status: Abnormal   Collection Time: 03/22/17  5:11 AM  Result Value Ref Range   Glucose-Capillary  190  (H) 65 - 99 mg/dL  I-stat troponin, ED     Status: None   Collection Time: 03/22/17  5:31 AM  Result Value Ref Range   Troponin i, poc 0.06 0.00 - 0.08 ng/mL   Comment 3            Comment: Due to the release kinetics of cTnI, a negative result within the first hours of the onset of symptoms does not rule out myocardial infarction with certainty. If myocardial infarction is still suspected, repeat the test at appropriate intervals.   I-Stat Chem 8, ED     Status: Abnormal   Collection Time: 03/22/17  5:33 AM  Result Value Ref Range   Sodium 140 135 - 145 mmol/L   Potassium 4.3 3.5 - 5.1 mmol/L   Chloride 106 101 - 111 mmol/L   BUN 20 6 - 20 mg/dL   Creatinine, Ser 1.30 (H) 0.61 - 1.24 mg/dL   Glucose, Bld 195 (H) 65 - 99 mg/dL   Calcium, Ion 1.07 (L) 1.15 - 1.40 mmol/L   TCO2 19 (L) 22 - 32 mmol/L   Hemoglobin 13.3 13.0 - 17.0 g/dL   HCT 39.0 39.0 - 52.0 %  CBG monitoring, ED     Status: Abnormal   Collection Time: 03/22/17  9:57 AM  Result Value Ref Range   Glucose-Capillary 114 (H) 65 - 99 mg/dL  Hemoglobin A1c     Status: Abnormal   Collection Time: 03/22/17 11:45 AM  Result Value Ref Range   Hgb A1c MFr Bld 6.2 (H) 4.8 - 5.6 %    Comment: (NOTE) Pre diabetes:          5.7%-6.4% Diabetes:              >6.4% Glycemic control for   <7.0% adults with diabetes    Mean Plasma Glucose 131.24 mg/dL    Comment: Performed at [facility], [address] [place], [place] [address]  Glucose, capillary     Status: Abnormal   Collection Time: 03/22/17 11:51 AM  Result Value Ref Range   Glucose-Capillary 107 (H) 65 - 99 mg/dL   Comment 1 Notify RN    Comment 2 Document in Chart   Glucose, capillary     Status: None   Collection Time: 03/22/17  3:49 PM  Result Value Ref Range   Glucose-Capillary 73 65 - 99 mg/dL   Comment 1 Notify RN    Comment 2 Document in Chart   Glucose, capillary     Status: Abnormal   Collection Time: 03/22/17  5:55 PM  Result Value Ref Range     Glucose-Capillary 104 (H) 65 - 99 mg/dL   Comment 1 Notify RN    Comment 2 Document in Chart   MRSA PCR Screening     Status: None   Collection Time: 03/22/17  6:45 PM  Result Value Ref Range   MRSA by PCR NEGATIVE NEGATIVE    Comment:        The GeneXpert MRSA Assay (FDA approved for NASAL specimens only), is one component of a comprehensive MRSA colonization surveillance program. It is not intended to diagnose MRSA infection nor to guide or monitor treatment for MRSA infections. Performed at [facility], [place] [address] [place], [place] [address]   Glucose, capillary     Status: Abnormal   Collection Time: 03/22/17  9:35 PM  Result Value Ref Range   Glucose-Capillary 103 (H) 65 - 99 mg/dL  Comment 1 Notify RN    Comment 2 Document in Chart   Glucose, capillary     Status: Abnormal   Collection Time: 03/23/17  6:03 AM  Result Value Ref Range   Glucose-Capillary 107 (H) 65 - 99 mg/dL   Comment 1 Notify RN    Comment 2 Document in Chart   Basic metabolic panel     Status: Abnormal   Collection Time: 03/23/17  7:30 AM  Result Value Ref Range   Sodium 137 135 - 145 mmol/L   Potassium 5.3 (H) 3.5 - 5.1 mmol/L    Comment: SPECIMEN HEMOLYZED. HEMOLYSIS MAY AFFECT INTEGRITY OF RESULTS.   Chloride 106 101 - 111 mmol/L   CO2 21 (L) 22 - 32 mmol/L   Glucose, Bld 92 65 - 99 mg/dL   BUN 12 6 - 20 mg/dL   Creatinine, Ser 1.19 0.61 - 1.24 mg/dL   Calcium 8.5 (L) 8.9 - 10.3 mg/dL   GFR calc non Af Amer 58 (L) >60 mL/min   GFR calc Af Amer >60 >60 mL/min    Comment: (NOTE) The eGFR has been calculated using the CKD EPI equation. This calculation has not been validated in all clinical situations. eGFR's persistently <60 mL/min signify possible Chronic Kidney Disease.    Anion gap 10 5 - 15    Comment: Performed at [facility] [address] [place], [place] [address]  CBC     Status: Abnormal   Collection Time: 03/23/17  7:30 AM  Result Value Ref Range   WBC  7.0 4.0 - 10.5 K/uL    Comment: WHITE COUNT CONFIRMED ON SMEAR   RBC 4.56 4.22 - 5.81 MIL/uL   Hemoglobin 13.0 13.0 - 17.0 g/dL   HCT 37.9 (L) 39.0 - 52.0 %   MCV 83.1 78.0 - 100.0 fL   MCH 28.5 26.0 - 34.0 pg   MCHC 34.3 30.0 - 36.0 g/dL   RDW 14.8 11.5 - 15.5 %   Platelets 147 (L) 150 - 400 K/uL    Comment: PLATELET COUNT CONFIRMED BY SMEAR Performed at [facility], [place] [address], [place], [place] [address]   Glucose, capillary     Status: Abnormal   Collection Time: 03/23/17 11:28 AM  Result Value Ref Range   Glucose-Capillary 154 (H) 65 - 99 mg/dL   Comment 1 Notify RN    Comment 2 Document in Chart   Potassium     Status: None   Collection Time: 03/23/17  2:22 PM  Result Value Ref Range   Potassium 3.9 3.5 - 5.1 mmol/L    Comment: Performed at [facility], [place] [address], [place],  [address]  Glucose, capillary     Status: None   Collection Time: 03/23/17  4:53 PM  Result Value Ref Range   Glucose-Capillary 87 65 - 99 mg/dL   Comment 1 Notify RN    Comment 2 Document in Chart     ECG   Sinus rhythm with 1st degree AVB at 60, incomplete RBBB, lateral T wave inversions - Personally Reviewed  Telemetry   Sinus bradycardia with 1st degree AVB, intermittent Mobitz I and II AVB - Personally Reviewed  Radiology   Ct Angio Head W Or Wo Contrast  Result Date: 03/22/2017 EXAM: CT ANGIOGRAPHY HEAD AND NECK CT PERFUSION BRAIN TECHNIQUE: Multidetector CT imaging of the head and neck was performed using the standard protocol during bolus administration of intravenous contrast. Multiplanar CT image reconstructions and MIPs  were obtained to evaluate the vascular anatomy. Carotid stenosis measurements (when applicable) are obtained utilizing NASCET criteria, using the distal internal carotid diameter as the denominator. Multiphase CT imaging of the brain was performed following IV bolus contrast injection. Subsequent parametric perfusion maps were calculated  using RAPID software. CONTRAST:  135m ISOVUE-370 IOPAMIDOL (ISOVUE-370) INJECTION 76% COMPARISON:  None. FINDINGS: CTA NECK FINDINGS Aortic arch: Visualized aortic arch of normal caliber with normal branch pattern. No high-grade stenosis about the origin of the great vessels. Visualized subclavian arteries widely patent. Right carotid system: Right common carotid artery demonstrates scattered atheromatous irregularity without flow-limiting stenosis. There is a severe near occlusive stenosis involving the proximal right ICA (series 7, image 206). String sign present. Stenosis measures approximately 4 mm in length. Right ICA patent distally to the skull base without additional stenosis, dissection, or occlusion. Left carotid system: Left common carotid artery tortuous proximally. A centric plaque within the mid-distal left common carotid artery without significant stenosis. Calcified plaque about the left carotid bifurcation/proximal left ICA associated stenosis of up to approximately 50% by NASCET criteria. Left ICA patent distally to the skull base without additional stenosis, dissection, or occlusion. Vertebral arteries: Both of the vertebral arteries arise from the subclavian arteries. Right vertebral artery occluded at its origin. Left vertebral artery patent within the neck without stenosis, dissection, or occlusion. Skeleton: Mild exaggeration of the normal cervical lordosis. Trace retrolisthesis of C5 on C6. No other listhesis or malalignment. Skull base intact. Normal C1-2 articulations are preserved in the dens is intact. Vertebral body heights maintained. No acute fracture. No abnormal prevertebral edema. Mild to moderate degenerate spondylolysis present at C3-4 through C6-7. Other neck: Soft tissues of the neck demonstrate no other acute abnormality. Salivary glands within normal limits. Thyroid normal. No adenopathy. Upper chest: Visualized upper chest demonstrates no acute abnormality. Partially  visualized lungs are grossly clear. Review of the MIP images confirms the above findings CTA HEAD FINDINGS Anterior circulation: Petrous right ICA patent proximally. There is a focal severe stenosis at the distal petrous/cavernous junction on the right (series 7, image 121). Extensive atheromatous disease throughout the cavernous/supraclinoid right ICA with moderate to severe diffuse narrowing. Right ICA is patent to the terminus. On the left, the petrous left ICA patent without high-grade stenosis. Extensive atheromatous plaque throughout the cavernous/supraclinoid left ICA with moderate to severe narrowing as well. Left ICA terminus patent. Multifocal moderate to severe stenoses present within the A1 segments bilaterally. Patent anterior communicating artery. Anterior cerebral arteries demonstrate extensive atheromatous irregularity with multifocal severe stenoses, right worse than left, but are patent to their distal aspects. Multifocal moderate to severe proximal and mid right M1 stenoses (series 7, image 92, 90). There are additional multifocal severe proximal right M2 stenoses. Right MCA branches are perfused distally, although demonstrate extensive atheromatous irregularity. Diffuse atheromatous irregularity throughout the left M1 segment with more mild to moderate multifocal narrowing at the mid aspect. Extensive atheromatous irregularity throughout the left MCA branches, which are perfused to their distal aspects. Posterior circulation: Extensive atheromatous irregularity with moderate to severe stenoses involving the mid and distal left V4 segment. Left PICA is patent. Right vertebral artery occluded. There is severe near occlusive stenosis at the vertebrobasilar junction/proximal basilar artery (series 7, image 135). Scant thready flow seen distally within the basilar artery with multifocal moderate to severe stenoses. Basilar is patent to its distal aspect. Superior cerebral arteries patent bilaterally.  Right PCA supplied via the basilar. Predominant fetal type origin of the left PCA supplied  via a patent and irregular left P com. Extensive atheromatous irregularity throughout the PCAs bilaterally with multifocal severe stenoses. PCAs are patent to their distal aspects. Venous sinuses: Grossly patent, although not well evaluated due to arterial timing of the contrast bolus Anatomic variants: Fetal type origin of the left PCA with underlying diminutive vertebrobasilar system. No aneurysm. Delayed phase: Not performed. Review of the MIP images confirms the above findings CT Brain Perfusion Findings: CBF (<30%) Volume: 25m Perfusion (Tmax>6.0s) volume: 062mMismatch Volume: 25m725mnfarction Location:No cerebral infarct by perfusion. Mildly elevated mean transit time in T-max within the right cerebral hemisphere most likely related to noted right ICA stenosis. IMPRESSION: 1. Negative CTA for emergent large vessel occlusion. No evidence for acute infarct by CT perfusion. 2. 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 3. 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 4. 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 Findings are associated with near occlusive stenosis at the proximal basilar artery, with markedly attenuated with irregular thready flow distally. 5. No acute abnormality within the cervical spine.  No fracture. Critical Value/emergent results were called by telephone at the time of interpretation on 03/22/2017 at 5:50 am to Dr. COUThayer JewMCNEILL KIRArmc Behavior[facility] Health Centerwho verbally acknowledged these results. Electronically Signed   By: BenJeannine BogaD.   On: 03/22/2017 06:36   Dg Chest 1 View  Result Date: 03/22/2017 CLINICAL DATA:  First degree AV block. EXAM: CHEST  1 VIEW COMPARISON:  None. FINDINGS: Cardiomegaly. Aortic  atherosclerosis. Possible pulmonary venous hypertension but no edema. No effusions. No focal lesion. No significant bone finding. IMPRESSION: Cardiomegaly. Aortic atherosclerosis. Possible venous hypertension without edema. Electronically Signed   By: MarNelson ChimesD.   On: 03/22/2017 13:57   Dg Abd 1 View  Result Date: 03/22/2017 CLINICAL DATA:  MRI contraindicated due to metal implant. EXAM: ABDOMEN - 1 VIEW COMPARISON:  None. FINDINGS: Bowel-gas pattern is not show any evidence of ileus or obstruction. Normal amount of fecal matter. Urinary tract contrast within the bladder. Surgical clips in the pelvis. No significant bone finding. Metallic densities overlie the lower abdomen. These could be external to the patient or could represent some sort of staple surgical closure. IMPRESSION: Contrast within the bladder. No abnormal bowel finding. Multiple densities overlying the lower abdomen that could be external to the patient or represent some sort of staple surgical closure. Surgical clips along the pelvic sidewall region on the left. Electronically Signed   By: MarNelson ChimesD.   On: 03/22/2017 13:58   Ct Angio Neck W And/or Wo Contrast  Result Date: 03/22/2017 EXAM: CT ANGIOGRAPHY HEAD AND NECK CT PERFUSION BRAIN TECHNIQUE: Multidetector CT imaging of the head and neck was performed using the standard protocol during bolus administration of intravenous contrast. Multiplanar CT image reconstructions and MIPs were obtained to evaluate the vascular anatomy. Carotid stenosis measurements (when applicable) are obtained utilizing NASCET criteria, using the distal internal carotid diameter as the denominator. Multiphase CT imaging of the brain was performed following IV bolus contrast injection. Subsequent parametric perfusion maps were calculated using RAPID software. CONTRAST:  1025m73mOVUE-370 IOPAMIDOL (ISOVUE-370) INJECTION 76% COMPARISON:  None. FINDINGS: CTA NECK FINDINGS Aortic arch: Visualized aortic  arch of normal caliber with normal branch pattern. No high-grade stenosis about the origin of the great vessels. Visualized subclavian arteries widely patent. Right carotid system: Right common carotid artery  demonstrates scattered atheromatous irregularity without flow-limiting stenosis. There is a severe near occlusive stenosis involving the proximal right ICA (series 7, image 206). String sign present. Stenosis measures approximately 4 mm in length. Right ICA patent distally to the skull base without additional stenosis, dissection, or occlusion. Left carotid system: Left common carotid artery tortuous proximally. A centric plaque within the mid-distal left common carotid artery without significant stenosis. Calcified plaque about the left carotid bifurcation/proximal left ICA associated stenosis of up to approximately 50% by NASCET criteria. Left ICA patent distally to the skull base without additional stenosis, dissection, or occlusion. Vertebral arteries: Both of the vertebral arteries arise from the subclavian arteries. Right vertebral artery occluded at its origin. Left vertebral artery patent within the neck without stenosis, dissection, or occlusion. Skeleton: Mild exaggeration of the normal cervical lordosis. Trace retrolisthesis of C5 on C6. No other listhesis or malalignment. Skull base intact. Normal C1-2 articulations are preserved in the dens is intact. Vertebral body heights maintained. No acute fracture. No abnormal prevertebral edema. Mild to moderate degenerate spondylolysis present at C3-4 through C6-7. Other neck: Soft tissues of the neck demonstrate no other acute abnormality. Salivary glands within normal limits. Thyroid normal. No adenopathy. Upper chest: Visualized upper chest demonstrates no acute abnormality. Partially visualized lungs are grossly clear. Review of the MIP images confirms the above findings CTA HEAD FINDINGS Anterior circulation: Petrous right ICA patent proximally. There  is a focal severe stenosis at the distal petrous/cavernous junction on the right (series 7, image 121). Extensive atheromatous disease throughout the cavernous/supraclinoid right ICA with moderate to severe diffuse narrowing. Right ICA is patent to the terminus. On the left, the petrous left ICA patent without high-grade stenosis. Extensive atheromatous plaque throughout the cavernous/supraclinoid left ICA with moderate to severe narrowing as well. Left ICA terminus patent. Multifocal moderate to severe stenoses present within the A1 segments bilaterally. Patent anterior communicating artery. Anterior cerebral arteries demonstrate extensive atheromatous irregularity with multifocal severe stenoses, right worse than left, but are patent to their distal aspects. Multifocal moderate to severe proximal and mid right M1 stenoses (series 7, image 92, 90). There are additional multifocal severe proximal right M2 stenoses. Right MCA branches are perfused distally, although demonstrate extensive atheromatous irregularity. Diffuse atheromatous irregularity throughout the left M1 segment with more mild to moderate multifocal narrowing at the mid aspect. Extensive atheromatous irregularity throughout the left MCA branches, which are perfused to their distal aspects. Posterior circulation: Extensive atheromatous irregularity with moderate to severe stenoses involving the mid and distal left V4 segment. Left PICA is patent. Right vertebral artery occluded. There is severe near occlusive stenosis at the vertebrobasilar junction/proximal basilar artery (series 7, image 135). Scant thready flow seen distally within the basilar artery with multifocal moderate to severe stenoses. Basilar is patent to its distal aspect. Superior cerebral arteries patent bilaterally. Right PCA supplied via the basilar. Predominant fetal type origin of the left PCA supplied via a patent and irregular left P com. Extensive atheromatous irregularity  throughout the PCAs bilaterally with multifocal severe stenoses. PCAs are patent to their distal aspects. Venous sinuses: Grossly patent, although not well evaluated due to arterial timing of the contrast bolus Anatomic variants: Fetal type origin of the left PCA with underlying diminutive vertebrobasilar system. No aneurysm. Delayed phase: Not performed. Review of the MIP images confirms the above findings CT Brain Perfusion Findings: CBF (<30%) Volume: 65m Perfusion (Tmax>6.0s) volume: 069mMismatch Volume: 63m71mnfarction Location:No cerebral infarct by perfusion. Mildly elevated mean transit time in  T-max within the right cerebral hemisphere most likely related to noted right ICA stenosis. IMPRESSION: 1. Negative CTA for emergent large vessel occlusion. No evidence for acute infarct by CT perfusion. 2. 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 3. 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 4. 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 Findings are associated with near occlusive stenosis at the proximal basilar artery, with markedly attenuated with irregular thready flow distally. 5. No acute abnormality within the cervical spine.  No fracture. Critical Value/emergent results were called by telephone at the time of interpretation on 03/22/2017 at 5:50 am to Dr. Thayer Jew ; [facility] , who verbally acknowledged these results. Electronically Signed   By: Jeannine Boga M.D.   On: 03/22/2017 06:36   Ct Cervical Spine Wo Contrast  Result Date: 03/22/2017 EXAM: CT ANGIOGRAPHY HEAD AND NECK CT PERFUSION BRAIN TECHNIQUE: Multidetector CT imaging of the head and neck was performed using the standard protocol during bolus administration of intravenous contrast. Multiplanar CT image reconstructions and MIPs were  obtained to evaluate the vascular anatomy. Carotid stenosis measurements (when applicable) are obtained utilizing NASCET criteria, using the distal internal carotid diameter as the denominator. Multiphase CT imaging of the brain was performed following IV bolus contrast injection. Subsequent parametric perfusion maps were calculated using RAPID software. CONTRAST:  161m ISOVUE-370 IOPAMIDOL (ISOVUE-370) INJECTION 76% COMPARISON:  None. FINDINGS: CTA NECK FINDINGS Aortic arch: Visualized aortic arch of normal caliber with normal branch pattern. No high-grade stenosis about the origin of the great vessels. Visualized subclavian arteries widely patent. Right carotid system: Right common carotid artery demonstrates scattered atheromatous irregularity without flow-limiting stenosis. There is a severe near occlusive stenosis involving the proximal right ICA (series 7, image 206). String sign present. Stenosis measures approximately 4 mm in length. Right ICA patent distally to the skull base without additional stenosis, dissection, or occlusion. Left carotid system: Left common carotid artery tortuous proximally. A centric plaque within the mid-distal left common carotid artery without significant stenosis. Calcified plaque about the left carotid bifurcation/proximal left ICA associated stenosis of up to approximately 50% by NASCET criteria. Left ICA patent distally to the skull base without additional stenosis, dissection, or occlusion. Vertebral arteries: Both of the vertebral arteries arise from the subclavian arteries. Right vertebral artery occluded at its origin. Left vertebral artery patent within the neck without stenosis, dissection, or occlusion. Skeleton: Mild exaggeration of the normal cervical lordosis. Trace retrolisthesis of C5 on C6. No other listhesis or malalignment. Skull base intact. Normal C1-2 articulations are preserved in the dens is intact. Vertebral body heights maintained. No acute fracture. No  abnormal prevertebral edema. Mild to moderate degenerate spondylolysis present at C3-4 through C6-7. Other neck: Soft tissues of the neck demonstrate no other acute abnormality. Salivary glands within normal limits. Thyroid normal. No adenopathy. Upper chest: Visualized upper chest demonstrates no acute abnormality. Partially visualized lungs are grossly clear. Review of the MIP images confirms the above findings CTA HEAD FINDINGS Anterior circulation: Petrous right ICA patent proximally. There is a focal severe stenosis at the distal petrous/cavernous junction on the right (series 7, image 121). Extensive atheromatous disease throughout the cavernous/supraclinoid right ICA with moderate to severe diffuse narrowing. Right ICA is patent to the terminus. On the left, the petrous left ICA patent without high-grade stenosis. Extensive atheromatous plaque throughout the cavernous/supraclinoid left ICA with moderate to severe  narrowing as well. Left ICA terminus patent. Multifocal moderate to severe stenoses present within the A1 segments bilaterally. Patent anterior communicating artery. Anterior cerebral arteries demonstrate extensive atheromatous irregularity with multifocal severe stenoses, right worse than left, but are patent to their distal aspects. Multifocal moderate to severe proximal and mid right M1 stenoses (series 7, image 92, 90). There are additional multifocal severe proximal right M2 stenoses. Right MCA branches are perfused distally, although demonstrate extensive atheromatous irregularity. Diffuse atheromatous irregularity throughout the left M1 segment with more mild to moderate multifocal narrowing at the mid aspect. Extensive atheromatous irregularity throughout the left MCA branches, which are perfused to their distal aspects. Posterior circulation: Extensive atheromatous irregularity with moderate to severe stenoses involving the mid and distal left V4 segment. Left PICA is patent. Right vertebral  artery occluded. There is severe near occlusive stenosis at the vertebrobasilar junction/proximal basilar artery (series 7, image 135). Scant thready flow seen distally within the basilar artery with multifocal moderate to severe stenoses. Basilar is patent to its distal aspect. Superior cerebral arteries patent bilaterally. Right PCA supplied via the basilar. Predominant fetal type origin of the left PCA supplied via a patent and irregular left P com. Extensive atheromatous irregularity throughout the PCAs bilaterally with multifocal severe stenoses. PCAs are patent to their distal aspects. Venous sinuses: Grossly patent, although not well evaluated due to arterial timing of the contrast bolus Anatomic variants: Fetal type origin of the left PCA with underlying diminutive vertebrobasilar system. No aneurysm. Delayed phase: Not performed. Review of the MIP images confirms the above findings CT Brain Perfusion Findings: CBF (<30%) Volume: 53m Perfusion (Tmax>6.0s) volume: 01mMismatch Volume: 40m540mnfarction Location:No cerebral infarct by perfusion. Mildly elevated mean transit time in T-max within the right cerebral hemisphere most likely related to noted right ICA stenosis. IMPRESSION: 1. Negative CTA for emergent large vessel occlusion. No evidence for acute infarct by CT perfusion. 2. 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 3. 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 4. 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 Findings are associated with near occlusive stenosis at the proximal basilar artery, with markedly attenuated with irregular thready flow distally. 5. No acute abnormality within the cervical spine.  No fracture. Critical Value/emergent results were called by telephone at the time of interpretation on 03/22/2017 at  5:50 am to Dr. COUTh[facility]who verbally acknowledged these results. Electronically Signed   By: BenJeannine BogaD.   On: 03/22/2017 06:36   Mr Brain Wo Contrast  Addendum Date: 03/23/2017   ADDENDUM REPORT: 03/23/2017 13:28 ADDENDUM: Voice recognition error: The first sentence of the impression section should read "Essentially diffusion only weighted images demonstrate No acute or subacute infarction. " Electronically Signed   By: ChrSan MorelleD.   On: 03/23/2017 13:28   Result Date: 03/23/2017 CLINICAL DATA:  Seizure, new, nontraumatic common greater than 40 11ars old. EXAM: MRI HEAD WITHOUT CONTRAST TECHNIQUE: Multiplanar, multiecho pulse sequences of the brain and surrounding structures were obtained without intravenous contrast. COMPARISON:  CTA head and neck 03/22/2017. FINDINGS: Brain: Study is severely degraded by patient motion. Sagittal and axial diffusion-weighted images demonstrate no acute or subacute infarction. Other sequences were not performed. Vascular: Not evaluated due to limited number sequences. Skull and upper cervical spine: Not evaluated patient motion and limited sequences. Sinuses/Orbits: Not evaluated due to patient motion  sequences. IMPRESSION: Essentially diffusion only weighted images demonstrate acute or subacute infarction. No seizure focus identified. Electronically Signed: By: San Morelle M.D. On: 03/22/2017 15:31   Ct Cerebral Perfusion W Contrast  Result Date: 03/22/2017 EXAM: CT ANGIOGRAPHY HEAD AND NECK CT PERFUSION BRAIN TECHNIQUE: Multidetector CT imaging of the head and neck was performed using the standard protocol during bolus administration of intravenous contrast. Multiplanar CT image reconstructions and MIPs were obtained to evaluate the vascular anatomy. Carotid stenosis measurements (when applicable) are obtained utilizing NASCET criteria, using the distal internal carotid diameter as the denominator.  Multiphase CT imaging of the brain was performed following IV bolus contrast injection. Subsequent parametric perfusion maps were calculated using RAPID software. CONTRAST:  131m ISOVUE-370 IOPAMIDOL (ISOVUE-370) INJECTION 76% COMPARISON:  None. FINDINGS: CTA NECK FINDINGS Aortic arch: Visualized aortic arch of normal caliber with normal branch pattern. No high-grade stenosis about the origin of the great vessels. Visualized subclavian arteries widely patent. Right carotid system: Right common carotid artery demonstrates scattered atheromatous irregularity without flow-limiting stenosis. There is a severe near occlusive stenosis involving the proximal right ICA (series 7, image 206). String sign present. Stenosis measures approximately 4 mm in length. Right ICA patent distally to the skull base without additional stenosis, dissection, or occlusion. Left carotid system: Left common carotid artery tortuous proximally. A centric plaque within the mid-distal left common carotid artery without significant stenosis. Calcified plaque about the left carotid bifurcation/proximal left ICA associated stenosis of up to approximately 50% by NASCET criteria. Left ICA patent distally to the skull base without additional stenosis, dissection, or occlusion. Vertebral arteries: Both of the vertebral arteries arise from the subclavian arteries. Right vertebral artery occluded at its origin. Left vertebral artery patent within the neck without stenosis, dissection, or occlusion. Skeleton: Mild exaggeration of the normal cervical lordosis. Trace retrolisthesis of C5 on C6. No other listhesis or malalignment. Skull base intact. Normal C1-2 articulations are preserved in the dens is intact. Vertebral body heights maintained. No acute fracture. No abnormal prevertebral edema. Mild to moderate degenerate spondylolysis present at C3-4 through C6-7. Other neck: Soft tissues of the neck demonstrate no other acute abnormality. Salivary glands  within normal limits. Thyroid normal. No adenopathy. Upper chest: Visualized upper chest demonstrates no acute abnormality. Partially visualized lungs are grossly clear. Review of the MIP images confirms the above findings CTA HEAD FINDINGS Anterior circulation: Petrous right ICA patent proximally. There is a focal severe stenosis at the distal petrous/cavernous junction on the right (series 7, image 121). Extensive atheromatous disease throughout the cavernous/supraclinoid right ICA with moderate to severe diffuse narrowing. Right ICA is patent to the terminus. On the left, the petrous left ICA patent without high-grade stenosis. Extensive atheromatous plaque throughout the cavernous/supraclinoid left ICA with moderate to severe narrowing as well. Left ICA terminus patent. Multifocal moderate to severe stenoses present within the A1 segments bilaterally. Patent anterior communicating artery. Anterior cerebral arteries demonstrate extensive atheromatous irregularity with multifocal severe stenoses, right worse than left, but are patent to their distal aspects. Multifocal moderate to severe proximal and mid right M1 stenoses (series 7, image 92, 90). There are additional multifocal severe proximal right M2 stenoses. Right MCA branches are perfused distally, although demonstrate extensive atheromatous irregularity. Diffuse atheromatous irregularity throughout the left M1 segment with more mild to moderate multifocal narrowing at the mid aspect. Extensive atheromatous irregularity throughout the left MCA branches, which are perfused to their distal aspects. Posterior circulation: Extensive atheromatous irregularity with moderate to severe stenoses involving the  mid and distal left V4 segment. Left PICA is patent. Right vertebral artery occluded. There is severe near occlusive stenosis at the vertebrobasilar junction/proximal basilar artery (series 7, image 135). Scant thready flow seen distally within the basilar  artery with multifocal moderate to severe stenoses. Basilar is patent to its distal aspect. Superior cerebral arteries patent bilaterally. Right PCA supplied via the basilar. Predominant fetal type origin of the left PCA supplied via a patent and irregular left P com. Extensive atheromatous irregularity throughout the PCAs bilaterally with multifocal severe stenoses. PCAs are patent to their distal aspects. Venous sinuses: Grossly patent, although not well evaluated due to arterial timing of the contrast bolus Anatomic variants: Fetal type origin of the left PCA with underlying diminutive vertebrobasilar system. No aneurysm. Delayed phase: Not performed. Review of the MIP images confirms the above findings CT Brain Perfusion Findings: CBF (<30%) Volume: 61m Perfusion (Tmax>6.0s) volume: 058mMismatch Volume: 49m29mnfarction Location:No cerebral infarct by perfusion. Mildly elevated mean transit time in T-max within the right cerebral hemisphere most likely related to noted right ICA stenosis. IMPRESSION: 1. Negative CTA for emergent large vessel occlusion. No evidence for acute infarct by CT perfusion. 2. 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 3. 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 4. 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 Findings are associated with near occlusive stenosis at the proximal basilar artery, with markedly attenuated with irregular thready flow distally. 5. No acute abnormality within the cervical spine.  No fracture. Critical Value/emergent results were called by telephone at the time of interpretation on 03/22/2017 at 5:50 am to Dr. COUThayer JewMCNEILL [facility]entonwho verbally acknowledged these results. Electronically Signed   By: BenJeannine BogaD.   On: 03/22/2017 06:36   Ct Head Code  Stroke Wo Contrast  Result Date: 03/22/2017 CLINICAL DATA:  Code stroke. Initial evaluation for acute fall, seizure activity. EXAM: CT HEAD WITHOUT CONTRAST TECHNIQUE: Contiguous axial images were obtained from the base of the skull through the vertex without intravenous contrast. COMPARISON:  None. FINDINGS: Brain: Generalized age-related cerebral atrophy. Moderate chronic small vessel ischemic disease. No acute intracranial hemorrhage. No acute large vessel territory infarct. No mass lesion, midline shift or mass effect. Diffuse ventricular prominence related global parenchymal volume loss of hydrocephalus. No extra-axial fluid collection. Vascular: No asymmetric hyperdense vessel. Scattered vascular calcifications noted within the carotid siphons. Skull: Scalp soft tissues and calvarium within normal limits. Remote posttraumatic defect noted at the right zygomatic arch. Sinuses/Orbits: Globes and orbital soft tissues within normal limits. Patient status post lens extraction bilaterally. Scattered mucosal thickening within the maxillary sinuses and ethmoidal air cells. Paranasal sinuses are otherwise clear. No mastoid effusion. Other: None. ASPECTS ([facility] Early CT Score) - Ganglionic level infarction (caudate, lentiform nuclei, internal capsule, insula, M1-M3 cortex): 7 - Supraganglionic infarction (M4-M6 cortex): 3 Total score (0-10 with 10 being normal): 10 IMPRESSION: 1. No acute intracranial abnormality identified. 2. ASPECTS is 10. 3. Moderate cerebral atrophy with chronic small vessel ischemic disease. These results were communicated to [facility] 5:36 amon 3/20/2019by text page via the [facility] LLCssaging system. Electronically Signed   By: BenJeannine BogaD.   On: 03/22/2017 05:39    Cardiac Studies   Echo pending  Impression   1. Principal Problem: 2.   Altered mental status 3. Active Problems: 4.   [place] ([place]  5.   Alzheimer's dementia with behavioral  disturbance 6.   Diabetes ([place]) 7.   Glaucoma 8.   Insomnia 9.   Hypertension 10.   Hyperlipemia 11.   Bipolar disorder ([place]) 12.   Acute encephalopathy 13.   Recommendation   1. 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 He is completely asymptomatic with this. Despite his dementia diagnosis, he is considered "high functioning" by neurology. At present, he does not meet criteria for pacemaker placement and given that he is asymptomatic, I would be hesitant to offer this. I would certainly avoid any AV nodal blocking agents. Will follow-up on his echo tomorrow.  Thanks for the consultation.  Time Spent Directly with Patient:  I have spent a total of 45 minutes with the patient reviewing hospital notes, telemetry, EKGs, labs and examining the patient as well as establishing an assessment and plan that was discussed personally with the patient. > 50% of time was spent in direct patient care.  Length of Stay:  LOS: 0 days   Pixie Casino, MD, [facility], [place] Director of the Advanced Lipid Disorders &  Cardiovascular Risk Reduction Clinic Diplomate of the American Board of Clinical Lipidology Attending Cardiologist  Direct Dial: [phone]  Fax: [phone]  Website:  [email or web address] [email or web address] Hilty 03/23/2017, 6:00 PM

## 2017-03-23 NOTE — Progress Notes (Signed)
Daughter Phillis Haggis brought advance directive. Paper copy in chart. Please contact Misty or Tyra for updates and test results. Thank you

## 2017-03-23 NOTE — Evaluation (Signed)
Clinical/Bedside Swallow Evaluation Patient Details  Name: Christino Baumgart MRN: [id number] Date of Birth: [birth year]  Today's Date: 03/23/2017 Time: SLP Start Time (ACUTE ONLY): 1751 SLP Stop Time (ACUTE ONLY): 0825 SLP Time Calculation (min) (ACUTE ONLY): 13 min  Past Medical History:  Past Medical History:  Diagnosis Date  . Alzheimer's dementia with behavioral disturbance   . Anxiety   . Bipolar affective disorder (HCC)   . BPH (benign prostatic hyperplasia)   . Depression   . First degree AV block   . Glaucoma   . High cholesterol   . Hypertension   . Insomnia   . Schizophrenia (HCC)   . Seizures (HCC) ~ 06/2016; ?03/21/2017  . Stroke [facility]) ?2017  . Type 2 diabetes, diet controlled (HCC)    Past Surgical History:  Past Surgical History:  Procedure Laterality Date  . APPENDECTOMY    . CHOLECYSTECTOMY    . EYE SURGERY    . GLAUCOMA SURGERY Bilateral   . REFRACTIVE SURGERY Bilateral    HPI:  77 y.o.malewith a Past Medical History of seizure d/o; HTN; HLD; glaucoma; DM; bipolar; and Alzheimer's with behavioral disturbance who presents with AMS. Dx possible seizure vs vertebrobasilar insufficiency in the setting ofbradycardia   Assessment / Plan / Recommendation Clinical Impression  Pt presents with normal oropharyngeal function marked by adequate mastication, brisk swallow response, no s/s of aspiration, even with large, successive sips.  Recommend resuming a regular solid diet, thin liquids, meds whole in liquid.  No SLP f/u warranted.  SLP Visit Diagnosis: Dysphagia, unspecified (R13.10)    Aspiration Risk  No limitations    Diet Recommendation   heart healthy/carb modified per MD  Medication Administration: Whole meds with liquid    Other  Recommendations Oral Care Recommendations: Oral care BID   Follow up Recommendations None      Frequency and Duration            Prognosis        Swallow Study   General Date of Onset: 03/23/17 HPI: 77  y.o.malewith a Past Medical History of seizure d/o; HTN; HLD; glaucoma; DM; bipolar; and Alzheimer's with behavioral disturbance who presents with AMS. Dx possible seizure vs vertebrobasilar insufficiency in the setting ofbradycardia Type of Study: Bedside Swallow Evaluation Diet Prior to this Study: NPO Temperature Spikes Noted: Yes Respiratory Status: Room air History of Recent Intubation: No Behavior/Cognition: Alert;Cooperative Oral Cavity Assessment: Within Functional Limits Oral Cavity - Dentition: Adequate natural dentition Vision: Functional for self-feeding Self-Feeding Abilities: Able to feed self Patient Positioning: Upright in bed Baseline Vocal Quality: Normal Volitional Cough: Strong Volitional Swallow: Able to elicit    Oral/Motor/Sensory Function Overall Oral Motor/Sensory Function: Within functional limits   Ice Chips Ice chips: Within functional limits   Thin Liquid Thin Liquid: Within functional limits    Nectar Thick Nectar Thick Liquid: Not tested   Honey Thick Honey Thick Liquid: Not tested   Puree Puree: Within functional limits   Solid   GO   Solid: Within functional limits        Blenda Mounts Laurice 03/23/2017,8:31 AM

## 2017-03-23 NOTE — Progress Notes (Signed)
PT Cancellation Note  Patient Details Name: Kashif Polak MRN: [id number] DOB: 11/05/1940   Cancelled Treatment:    Reason Eval/Treat Not Completed: Active bedrest order;Medical issues which prohibited therapy(Pt with active bedrest orders; also of note, Neuro recommending cariology w/u regarding bradycardia in the setting of severe cerebrobasilar insufficieny. RN aware. )  11:47 AM, 03/23/17 Rosamaria Lints, PT, DPT Physical Therapist - [place] [phone] (Pager)  [phone] (Office)      Presleigh Feldstein C 03/23/2017, 11:47 AM

## 2017-03-23 NOTE — Progress Notes (Signed)
PROGRESS NOTE  Frank Key ZOX:[id number] DOB: [birth year] DOA: 03/22/2017 PCP: Frank Parker, MD  HPI/Recap of past 24 hours: Frank Key is a 77 y.o. male with medical history significant for Alzheimer's dementia, bipolar disorder, diabetes, glaucoma, BPH, hypercholesterolemia, hypertension, history of seizures, resident of a nursing facility brought to the ED for evaluation of altered mental status and seizure-like activity.  Last seen normal prior to going to bed, he initially appeared to have decreased movement in the left upper extremity, was somnolent, Pain after taking Midazolam.He was found on the floor around 4:30 in the morning, and assuming that the patient had a fall due to abrasion in the left forehead, in showing tremors, he was sent to the emergency department for further evaluation.   03/23/2017 patient seen and examined at his bedside.  He is alert however he is confused with baseline dementia.  Spoke with his daughter who was concerned about his returning to his memory facility.  Supposedly not taking his medications there.  RN reports intermittent bradycardia.  EKG reveals first-degree AV block.  Patient is asymptomatic during these episodes.  Assessment/Plan: Principal Problem:   Altered mental status Active Problems:   Seizure (HCC)   Alzheimer's dementia with behavioral disturbance   Diabetes (HCC)   Glaucoma   Insomnia   Hypertension   Hyperlipemia   Bipolar disorder (HCC)   Acute encephalopathy    Acute encephalopathy most likely secondary to post ictal in the setting of noncompliance with antiepileptic drugs Suspect noncompliance with medications Started on IV Keppra and IV Depakote  continue to monitor overnight Follow-up with neurology in the outpatient setting MRI brain did not reveal any acute intracranial findings Neurology has seen the patient and signed off Continue aspirin and Lipitor for stroke prevention  Bradycardia EKG revealed 2nd degree  AV block with sinus pause BP is stable Continue telemetry 2D echo ordered Will consult cardiology   Bipolar disorder/depression/anxiety/dementia Continue home meds Reorient as needed  HTN Added amlodipine  continue to monitor  Ambulatory dysfunction PT evaluate and treat  Hx of seizures with suspected medical noncompliance May need SNF placement for assistance with meds  BPH Continue home meds finesteride Monitor urine output    Code Status: Full code.  Family Communication: At bedside.  Disposition Plan: SNF when medically stable   Consultants:  Case manager  Social worker  Procedures:  None  Antimicrobials:  None  DVT prophylaxis: SCDs, heparin subcu 5000 units 3 times daily   Objective: Vitals:   03/23/17 0418 03/23/17 0848 03/23/17 1222 03/23/17 1643  BP: (!) 188/83 (!) 168/84 (!) 150/74 (!) 159/74  Pulse: 69 65 69 61  Resp: 18 20 20 20   Temp: 99.5 F (37.5 C) 98.9 F (37.2 C) 99.2 F (37.3 C) 98 F (36.7 C)  TempSrc: Oral Oral Oral Oral  SpO2: 100% 100% 98% 98%    Intake/Output Summary (Last 24 hours) at 03/23/2017 1723 Last data filed at 03/23/2017 1223 Gross per 24 hour  Intake 223.75 ml  Output 1200 ml  Net -976.25 ml   There were no vitals filed for this visit.  Exam:   General: 77 year old African-American male well-developed well-nourished in no acute distress.  Alert but continues in the state of dementia.  Cardiovascular: Regular rate and rhythm with no rubs or gallops.  Respiratory: Clear to auscultation with no wheezes or rales.  Abdomen: Obese nontender nondistended normal bowel sounds x4.  Musculoskeletal: No focal deficits noted.  Skin: No rash noted  Psychiatry: Mood is anxious.  Data Reviewed: CBC: Recent Labs  Lab 03/22/17 0508 03/22/17 0533 03/23/17 0730  WBC 8.2  --  7.0  NEUTROABS 4.7  --   --   HGB 12.4* 13.3 13.0  HCT 38.5* 39.0 37.9*  MCV 84.2  --  83.1  PLT 176  --  147*   Basic  Metabolic Panel: Recent Labs  Lab 03/22/17 0508 03/22/17 0533 03/23/17 0730 03/23/17 1422  NA 137 140 137  --   K 4.2 4.3 5.3* 3.9  CL 104 106 106  --   CO2 17*  --  21*  --   GLUCOSE 195* 195* 92  --   BUN 16 20 12   --   CREATININE 1.45* 1.30* 1.19  --   CALCIUM 8.7*  --  8.5*  --    GFR: CrCl cannot be calculated (Unknown ideal weight.). Liver Function Tests: Recent Labs  Lab 03/22/17 0508  AST 34  ALT 19  ALKPHOS 73  BILITOT <0.1*  PROT 6.3*  ALBUMIN 3.5   No results for input(s): LIPASE, AMYLASE in the last 168 hours. No results for input(s): AMMONIA in the last 168 hours. Coagulation Profile: Recent Labs  Lab 03/22/17 0508  INR 1.08   Cardiac Enzymes: No results for input(s): CKTOTAL, CKMB, CKMBINDEX, TROPONINI in the last 168 hours. BNP (last 3 results) No results for input(s): PROBNP in the last 8760 hours. HbA1C: Recent Labs    03/22/17 1145  HGBA1C 6.2*   CBG: Recent Labs  Lab 03/22/17 1755 03/22/17 2135 03/23/17 0603 03/23/17 1128 03/23/17 1653  GLUCAP 104* 103* 107* 154* 87   Lipid Profile: No results for input(s): CHOL, HDL, LDLCALC, TRIG, CHOLHDL, LDLDIRECT in the last 72 hours. Thyroid Function Tests: No results for input(s): TSH, T4TOTAL, FREET4, T3FREE, THYROIDAB in the last 72 hours. Anemia Panel: No results for input(s): VITAMINB12, FOLATE, FERRITIN, TIBC, IRON, RETICCTPCT in the last 72 hours. Urine analysis:    Component Value Date/Time   COLORURINE YELLOW 03/22/2017 0508   APPEARANCEUR CLEAR 03/22/2017 0508   LABSPEC 1.028 03/22/2017 0508   PHURINE 8.0 03/22/2017 0508   GLUCOSEU NEGATIVE 03/22/2017 0508   HGBUR NEGATIVE 03/22/2017 0508   BILIRUBINUR NEGATIVE 03/22/2017 0508   KETONESUR NEGATIVE 03/22/2017 0508   PROTEINUR 100 (A) 03/22/2017 0508   NITRITE NEGATIVE 03/22/2017 0508   LEUKOCYTESUR NEGATIVE 03/22/2017 0508   Sepsis Labs: @LABRCNTIP (procalcitonin:4,lacticidven:4)  ) Recent Results (from the past 240  hour(s))  MRSA PCR Screening     Status: None   Collection Time: 03/22/17  6:45 PM  Result Value Ref Range Status   MRSA by PCR NEGATIVE NEGATIVE Final    Comment:        The GeneXpert MRSA Assay (FDA approved for NASAL specimens only), is one component of a comprehensive MRSA colonization surveillance program. It is not intended to diagnose MRSA infection nor to guide or monitor treatment for MRSA infections. Performed at [facility], [address] [place], [place] [address]       Studies: No results found.  Scheduled Meds: . amLODipine  5 mg Oral Daily  . ARIPiprazole  10 mg Oral Daily  . aspirin EC  81 mg Oral BH-q7a  . atorvastatin  20 mg Oral q1800  . brimonidine  1 drop Both Eyes BID  . donepezil  5 mg Oral QHS  . finasteride  5 mg Oral Daily  . heparin  5,000 Units Subcutaneous Q8H  . insulin aspart  0-9 Units Subcutaneous TID WC  .  latanoprost  1 drop Both Eyes QHS  . mirtazapine  15 mg Oral QHS  . sodium chloride  2 spray Each Nare Daily  . tamsulosin  0.4 mg Oral QPC supper  . traZODone  75 mg Oral QHS    Continuous Infusions: . levETIRAcetam Stopped (03/23/17 0627)  . valproate sodium 625 mg (03/23/17 0915)     LOS: 0 days     Darlin Drop, MD [facility] Pager [phone]  If 7PM-7AM, please contact night-coverage [email or web address] Password [facility] 03/23/2017, 5:23 PM

## 2017-03-23 NOTE — Progress Notes (Signed)
Patient having increasing bradycardic episodes and lengthened pauses. MD notified, verbal order given to obtain EKG. Results given to MD

## 2017-03-24 ENCOUNTER — Inpatient Hospital Stay (HOSPITAL_COMMUNITY): Payer: Medicare Other

## 2017-03-24 DIAGNOSIS — G9341 Metabolic encephalopathy: Secondary | ICD-10-CM

## 2017-03-24 DIAGNOSIS — R410 Disorientation, unspecified: Secondary | ICD-10-CM

## 2017-03-24 DIAGNOSIS — I351 Nonrheumatic aortic (valve) insufficiency: Secondary | ICD-10-CM

## 2017-03-24 LAB — GLUCOSE, CAPILLARY
GLUCOSE-CAPILLARY: 186 mg/dL — AB (ref 65–99)
Glucose-Capillary: 109 mg/dL — ABNORMAL HIGH (ref 65–99)
Glucose-Capillary: 128 mg/dL — ABNORMAL HIGH (ref 65–99)
Glucose-Capillary: 155 mg/dL — ABNORMAL HIGH (ref 65–99)

## 2017-03-24 LAB — ECHOCARDIOGRAM COMPLETE: Weight: 3587.33 oz

## 2017-03-24 MED ORDER — VALPROATE SODIUM 250 MG/5ML PO SOLN
625.0000 mg | Freq: Two times a day (BID) | ORAL | Status: DC
  Administered 2017-03-24 – 2017-03-27 (×7): 625 mg via ORAL
  Filled 2017-03-24 (×8): qty 15

## 2017-03-24 MED ORDER — LEVETIRACETAM 100 MG/ML PO SOLN
750.0000 mg | Freq: Two times a day (BID) | ORAL | Status: DC
  Administered 2017-03-24 – 2017-03-27 (×6): 750 mg via ORAL
  Filled 2017-03-24 (×7): qty 7.5

## 2017-03-24 NOTE — Progress Notes (Signed)
  Echocardiogram 2D Echocardiogram has been performed.  Ceil Roderick T Pesach Frisch 03/24/2017, 11:00 AM

## 2017-03-24 NOTE — Evaluation (Signed)
Physical Therapy Evaluation Patient Details Name: Frank Key MRN: [id number] DOB: 11-Jul-1940 Today's Date: 03/24/2017   History of Present Illness  Frank Key is a 77yo black male who comes [facility] on 3/[facility] s/p fall, found on floor with head abrasion, seizure-like presentation. PMH: seizure d/o, HTN, HLD, glaucoma, DM, bipolar, alzheimer's dementia. Pt was held 1DA d/t bradycardia and 1st degree AVB, now s/p cardiology consult, recommending conservative management at this time.   Clinical Impression  Pt admitted with above diagnosis. Pt currently with functional limitations due to the deficits listed below (see "PT Problem List"). Upon entry, the patient is received semirecumbent in bed, no family/caregiver present. The pt is awake, very drowsy, and agreeable to participate. No acute distress noted at this time. The pt is oriented to self only, pleasant, conversational, and following simple commands consistently. Functional mobility assessment demonstrates moderate to heavy strength impairment globally, the pt now requiring Min-assist physical assistance for transfers, bed mobility and gait labored with additional time requirements, whereas the patient performed these at a higher level of independence PTA. Pt will benefit from skilled PT intervention to increase independence and safety with basic mobility in preparation for discharge to the venue listed below.       Follow Up Recommendations SNF;Supervision for mobility/OOB;Supervision/Assistance - 24 hour    Equipment Recommendations  Other (comment)(TBD by facility)    Recommendations for Other Services       Precautions / Restrictions Precautions Precautions: Fall Precaution Comments: seizure  Restrictions Weight Bearing Restrictions: No      Mobility  Bed Mobility Overal bed mobility: Needs Assistance Bed Mobility: Supine to Sit     Supine to sit: Supervision     General bed mobility comments: additional time and heavy  effort required   Transfers Overall transfer level: Needs assistance Equipment used: Rolling walker (2 wheeled) Transfers: Sit to/from Stand Sit to Stand: Min assist         General transfer comment: significant weakness, high fluency with forward rocking momentum for STS; maintains standing for <30sec d/t "feeling tired", confirms leg weakness   Ambulation/Gait Ambulation/Gait assistance: Min guard Ambulation Distance (Feet): 10 Feet Assistive device: Rolling walker (2 wheeled) Gait Pattern/deviations: Trunk flexed     General Gait Details: bed to chair only d/t weakness, HR remains in 70s, reports some mild dizziness upon sitting up, none walking. mild difficulty navigating obstacles with RW in room .  Stairs            Wheelchair Mobility    Modified Rankin (Stroke Patients Only)       Balance                                             Pertinent Vitals/Pain Pain Assessment: No/denies pain    Home Living Family/patient expects to be discharged to:: Skilled nursing facility([facility], [place] )                 Additional Comments: Pt unable to provide accurate history d/t alzheimers dementia     Prior Function Level of Independence: Needs assistance   Gait / Transfers Assistance Needed: per patient SPC without AMB limits, needs clarification from family  ADL's / Homemaking Assistance Needed: Unknown at this time.         Hand Dominance        Extremity/Trunk Assessment  Lower Extremity Assessment Lower Extremity Assessment: Generalized weakness       Communication   Communication: No difficulties  Cognition Arousal/Alertness: Lethargic Behavior During Therapy: WFL for tasks assessed/performed Overall Cognitive Status: History of cognitive impairments - at baseline                                 General Comments: pt calm, cooperative, following commands, very drowsy      General  Comments      Exercises     Assessment/Plan    PT Assessment Patient needs continued PT services  PT Problem List Decreased strength;Decreased activity tolerance;Decreased balance;Decreased mobility;Decreased safety awareness;Decreased knowledge of precautions       PT Treatment Interventions Gait training;Balance training;Functional mobility training;Therapeutic activities;Therapeutic exercise;Patient/family education    PT Goals (Current goals can be found in the Care Plan section)  Acute Rehab PT Goals Patient Stated Goal: regain leg strength  PT Goal Formulation: With patient Time For Goal Achievement: 04/07/17 Potential to Achieve Goals: Good    Frequency Min 3X/week   Barriers to discharge Decreased caregiver support ALF resident, will need more assistance with mobility, WC, ADL help at DC.     Co-evaluation               AM-PAC PT "6 Clicks" Daily Activity  Outcome Measure Difficulty turning over in bed (including adjusting bedclothes, sheets and blankets)?: A Lot Difficulty moving from lying on back to sitting on the side of the bed? : A Lot Difficulty sitting down on and standing up from a chair with arms (e.g., wheelchair, bedside commode, etc,.)?: A Lot Help needed moving to and from a bed to chair (including a wheelchair)?: A Little Help needed walking in hospital room?: A Lot Help needed climbing 3-5 steps with a railing? : A Lot 6 Click Score: 13    End of Session Equipment Utilized During Treatment: Gait belt Activity Tolerance: Patient limited by fatigue Patient left: in chair;with call bell/phone within reach;with bed alarm set;with chair alarm set(Breakfast tray presensted ) Nurse Communication: Mobility status PT Visit Diagnosis: Unsteadiness on feet (R26.81);Difficulty in walking, not elsewhere classified (R26.2);Muscle weakness (generalized) (M62.81);History of falling (Z91.81);Other symptoms and signs involving the nervous system (R29.898)     Time: 1610-9604 PT Time Calculation (min) (ACUTE ONLY): 25 min   Charges:   PT Evaluation $PT Eval Moderate Complexity: 1 Mod PT Treatments $Therapeutic Activity: 8-22 mins   PT G Codes:        9:14 AM, April 16, 2017 Rosamaria Lints, PT, DPT Physical Therapist - [place] [phone] (Pager)  [phone] (Office)     Perl Folmar C 04/16/17, 9:09 AM

## 2017-03-24 NOTE — Care Management Note (Signed)
Case Management Note  Patient Details  Name: Frank Key MRN: [id number] Date of Birth: February 25, 1940  Subjective/Objective:    Pt admitted with CVA. He is from [facility]F.                 Action/Plan: PT recommending SNF. CM consulted for medication needs. Pt is from ALF and will either return to ALF or SNF. These facilities will provide the patient with the medications he needs. CM following for further d/c needs.   Expected Discharge Date:                  Expected Discharge Plan:  Skilled Nursing Facility  In-House Referral:  Clinical Social Work  Discharge planning Services     Post Acute Care Choice:    Choice offered to:     DME Arranged:    DME Agency:     HH Arranged:    HH Agency:     Status of Service:  In process, will continue to follow  If discussed at Long Length of Stay Meetings, dates discussed:    Additional Comments:  Kermit Balo, RN 03/24/2017, 10:32 AM

## 2017-03-24 NOTE — Progress Notes (Signed)
DAILY PROGRESS NOTE   Patient Name: Frank Key Date of Encounter: 03/24/2017  Chief Complaint   No complaints  Patient Profile   77 yo male with dementia, presents with altered mental status and possible seizure. Found to have second degree AVB on monitor.  Subjective   Noted to have some PVC's overnight- salvo, 1st degree AVB and Mobitz 1 AVB - asymptomatic. Echo personally reviewed at bedside today - he has severe LV wall thickness, up to 2.1 cm, with normal LV function, mild AI and moderate LAE. 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 Could also be HCM. I suspect that the LV wall thickening has lead to his conduction disorder.   Objective   Vitals:   03/24/17 0410 03/24/17 0500 03/24/17 0736 03/24/17 0859  BP: (!) 154/72  (!) 171/71   Pulse: 79  65 74  Resp: 20  19   Temp: 98.2 F (36.8 C)  98 F (36.7 C)   TempSrc: Oral  Axillary   SpO2: 98%  98%   Weight:  224 lb 3.3 oz (101.7 kg)      Intake/Output Summary (Last 24 hours) at 03/24/2017 1055 Last data filed at 03/24/2017 0736 Gross per 24 hour  Intake 395 ml  Output 1925 ml  Net -1530 ml   Filed Weights   03/24/17 0500  Weight: 224 lb 3.3 oz (101.7 kg)    Physical Exam   General appearance: no distress and sleepy Neck: no carotid bruit, no JVD and thyroid not enlarged, symmetric, no tenderness/mass/nodules Lungs: clear to auscultation bilaterally Heart: regular rate and rhythm, S1, S2 normal and diastolic murmur: early diastolic 2/6, blowing at 2nd left intercostal space Abdomen: soft, non-tender; bowel sounds normal; no masses,  no organomegaly Extremities: extremities normal, atraumatic, no cyanosis or edema Pulses: 2+ and symmetric Skin: Skin color, texture, turgor normal. No rashes or lesions Neurologic: Mental status: Awake, NAD Psych: Pleasant  Inpatient Medications    Scheduled Meds: . amLODipine  10 mg Oral Daily  .  ARIPiprazole  10 mg Oral Daily  . aspirin EC  81 mg Oral BH-q7a  . atorvastatin  20 mg Oral q1800  . brimonidine  1 drop Both Eyes BID  . donepezil  5 mg Oral QHS  . finasteride  5 mg Oral Daily  . heparin  5,000 Units Subcutaneous Q8H  . insulin aspart  0-9 Units Subcutaneous TID WC  . latanoprost  1 drop Both Eyes QHS  . mirtazapine  15 mg Oral QHS  . sodium chloride  2 spray Each Nare Daily  . tamsulosin  0.4 mg Oral QPC supper  . traZODone  75 mg Oral QHS    Continuous Infusions: . levETIRAcetam Stopped (03/24/17 0646)  . valproate sodium Stopped (03/23/17 2332)    PRN Meds: acetaminophen **OR** acetaminophen, bisacodyl, hydrALAZINE, LORazepam, ondansetron **OR** ondansetron (ZOFRAN) IV, sodium phosphate   Labs   Results for orders placed or performed during the hospital encounter of 03/22/17 (from the past 48 hour(s))  Hemoglobin A1c     Status: Abnormal   Collection Time: 03/22/17 11:45 AM  Result Value Ref Range   Hgb A1c MFr Bld 6.2 (H) 4.8 - 5.6 %    Comment: (NOTE) Pre diabetes:          5.7%-6.4% Diabetes:              >6.4% Glycemic control for   <7.0% adults with diabetes    Mean  Plasma Glucose 131.24 mg/dL    Comment: Performed at [facility], [place] [address] [place], [place] [address]  Glucose, capillary     Status: Abnormal   Collection Time: 03/22/17 11:51 AM  Result Value Ref Range   Glucose-Capillary 107 (H) 65 - 99 mg/dL   Comment 1 Notify RN    Comment 2 Document in Chart   Glucose, capillary     Status: None   Collection Time: 03/22/17  3:49 PM  Result Value Ref Range   Glucose-Capillary 73 65 - 99 mg/dL   Comment 1 Notify RN    Comment 2 Document in Chart   Glucose, capillary     Status: Abnormal   Collection Time: 03/22/17  5:55 PM  Result Value Ref Range   Glucose-Capillary 104 (H) 65 - 99 mg/dL   Comment 1 Notify RN    Comment 2 Document in Chart   MRSA PCR Screening     Status: None   Collection Time: 03/22/17  6:45 PM    Result Value Ref Range   MRSA by PCR NEGATIVE NEGATIVE    Comment:        The GeneXpert MRSA Assay (FDA approved for NASAL specimens only), is one component of a comprehensive MRSA colonization surveillance program. It is not intended to diagnose MRSA infection nor to guide or monitor treatment for MRSA infections. Performed at [facility], [place] [address], [place], [place] [address]   Glucose, capillary     Status: Abnormal   Collection Time: 03/22/17  9:35 PM  Result Value Ref Range   Glucose-Capillary 103 (H) 65 - 99 mg/dL   Comment 1 Notify RN    Comment 2 Document in Chart   Glucose, capillary     Status: Abnormal   Collection Time: 03/23/17  6:03 AM  Result Value Ref Range   Glucose-Capillary 107 (H) 65 - 99 mg/dL   Comment 1 Notify RN    Comment 2 Document in Chart   Basic metabolic panel     Status: Abnormal   Collection Time: 03/23/17  7:30 AM  Result Value Ref Range   Sodium 137 135 - 145 mmol/L   Potassium 5.3 (H) 3.5 - 5.1 mmol/L    Comment: SPECIMEN HEMOLYZED. HEMOLYSIS MAY AFFECT INTEGRITY OF RESULTS.   Chloride 106 101 - 111 mmol/L   CO2 21 (L) 22 - 32 mmol/L   Glucose, Bld 92 65 - 99 mg/dL   BUN 12 6 - 20 mg/dL   Creatinine, Ser 1.19 0.61 - 1.24 mg/dL   Calcium 8.5 (L) 8.9 - 10.3 mg/dL   GFR calc non Af Amer 58 (L) >60 mL/min   GFR calc Af Amer >60 >60 mL/min    Comment: (NOTE) The eGFR has been calculated using the CKD EPI equation. This calculation has not been validated in all clinical situations. eGFR's persistently <60 mL/min signify possible Chronic Kidney Disease.    Anion gap 10 5 - 15    Comment: Performed at [place] [address] [place], [place] [address]  CBC     Status: Abnormal   Collection Time: 03/23/17  7:30 AM  Result Value Ref Range   WBC 7.0 4.0 - 10.5 K/uL    Comment: WHITE COUNT CONFIRMED ON SMEAR   RBC 4.56 4.22 - 5.81 MIL/uL   Hemoglobin 13.0 13.0 - 17.0 g/dL   HCT 37.9 (L) 39.0 - 52.0 %   MCV 83.1  78.0 - 100.0 fL  MCH 28.5 26.0 - 34.0 pg   MCHC 34.3 30.0 - 36.0 g/dL   RDW 14.8 11.5 - 15.5 %   Platelets 147 (L) 150 - 400 K/uL    Comment: PLATELET COUNT CONFIRMED BY SMEAR Performed at [facility], [place] [address], [place], [place] [address]   Glucose, capillary     Status: Abnormal   Collection Time: 03/23/17 11:28 AM  Result Value Ref Range   Glucose-Capillary 154 (H) 65 - 99 mg/dL   Comment 1 Notify RN    Comment 2 Document in Chart   Potassium     Status: None   Collection Time: 03/23/17  2:22 PM  Result Value Ref Range   Potassium 3.9 3.5 - 5.1 mmol/L    Comment: Performed at [facility], [place] [address], [place], [place] [address]  Glucose, capillary     Status: None   Collection Time: 03/23/17  4:53 PM  Result Value Ref Range   Glucose-Capillary 87 65 - 99 mg/dL   Comment 1 Notify RN    Comment 2 Document in Chart   Glucose, capillary     Status: Abnormal   Collection Time: 03/23/17  9:50 PM  Result Value Ref Range   Glucose-Capillary 162 (H) 65 - 99 mg/dL   Comment 1 Notify RN    Comment 2 Document in Chart   Glucose, capillary     Status: Abnormal   Collection Time: 03/24/17  6:04 AM  Result Value Ref Range   Glucose-Capillary 109 (H) 65 - 99 mg/dL   Comment 1 Notify RN    Comment 2 Document in Chart     ECG   Repeat EKG pending  Telemetry   Sinus with 1st degree AVB, Mobitz I block, PVC;'s - Personally Reviewed  Radiology    Dg Chest 1 View  Result Date: 03/22/2017 CLINICAL DATA:  First degree AV block. EXAM: CHEST  1 VIEW COMPARISON:  None. FINDINGS: Cardiomegaly. Aortic atherosclerosis. Possible pulmonary venous hypertension but no edema. No effusions. No focal lesion. No significant bone finding. IMPRESSION: Cardiomegaly. Aortic atherosclerosis. Possible venous hypertension without edema. Electronically Signed   By: Nelson Chimes M.D.   On: 03/22/2017 13:57   Dg Abd 1 View  Result Date: 03/22/2017 CLINICAL DATA:  MRI contraindicated  due to metal implant. EXAM: ABDOMEN - 1 VIEW COMPARISON:  None. FINDINGS: Bowel-gas pattern is not show any evidence of ileus or obstruction. Normal amount of fecal matter. Urinary tract contrast within the bladder. Surgical clips in the pelvis. No significant bone finding. Metallic densities overlie the lower abdomen. These could be external to the patient or could represent some sort of staple surgical closure. IMPRESSION: Contrast within the bladder. No abnormal bowel finding. Multiple densities overlying the lower abdomen that could be external to the patient or represent some sort of staple surgical closure. Surgical clips along the pelvic sidewall region on the left. Electronically Signed   By: Nelson Chimes M.D.   On: 03/22/2017 13:58   Mr Brain Wo Contrast  Addendum Date: 03/23/2017   ADDENDUM REPORT: 03/23/2017 13:28 ADDENDUM: Voice recognition error: The first sentence of the impression section should read "Essentially diffusion only weighted images demonstrate No acute or subacute infarction. " Electronically Signed   By: San Morelle M.D.   On: 03/23/2017 13:28   Result Date: 03/23/2017 CLINICAL DATA:  Seizure, new, nontraumatic common greater than 62 years old. EXAM: MRI HEAD WITHOUT CONTRAST TECHNIQUE: Multiplanar, multiecho pulse sequences of the brain and  surrounding structures were obtained without intravenous contrast. COMPARISON:  CTA head and neck 03/22/2017. FINDINGS: Brain: Study is severely degraded by patient motion. Sagittal and axial diffusion-weighted images demonstrate no acute or subacute infarction. Other sequences were not performed. Vascular: Not evaluated due to limited number sequences. Skull and upper cervical spine: Not evaluated patient motion and limited sequences. Sinuses/Orbits: Not evaluated due to patient motion sequences. IMPRESSION: Essentially diffusion only weighted images demonstrate acute or subacute infarction. No seizure focus identified. Electronically  Signed: By: San Morelle M.D. On: 03/22/2017 15:31    Cardiac Studies   Echo pending  Assessment   1. Principal Problem: 2.   Altered mental status 3. Active Problems: 4.   Seizure ([place]) 5.   Alzheimer's dementia with behavioral disturbance 6.   Diabetes ([place]) 7.   Glaucoma 8.   Insomnia 9.   Hypertension 10.   Hyperlipemia 11.   Bipolar disorder ([employer]) 12.   Acute encephalopathy 13.   Acute metabolic encephalopathy 14.   Plan   1. 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 LVEF is normal. He remains asymptomatic. Would recommend observation at this time. Could consider outpatient monitoring.   Time Spent Directly with Patient:  I have spent a total of 15 minutes with the patient reviewing hospital notes, telemetry, EKGs, labs and examining the patient as well as establishing an assessment and plan that was discussed personally with the patient. > 50% of time was spent in direct patient care.  Length of Stay:  LOS: 1 day   Pixie Casino, MD, [facility], [place] Director of the Advanced Lipid Disorders &  Cardiovascular Risk Reduction Clinic Diplomate of the American Board of Clinical Lipidology Attending Cardiologist  Direct Dial: [phone]  Fax: [phone]  Website:  [email or web address] 03/24/2017, 10:55 AM

## 2017-03-24 NOTE — NC FL2 (Addendum)
[place] MEDICAID FL2 LEVEL OF CARE SCREENING TOOL     IDENTIFICATION  Patient Name: Frank Key Birthdate: July 15, 1940 Sex: male Admission Date (Current Location): 03/22/2017  [facility] and [place] Number:  Producer, television/film/video and Address:  [facility], [address], [place], [place] [address]      Provider Number: [id number]  Attending Physician Name and Address:  Darlin Drop, DO  Relative Name and Phone Number:       Current Level of Care: Hospital Recommended Level of Care: Skilled Nursing Facility Prior Approval Number:    Date Approved/Denied:   PASRR Number:   [id number]   Discharge Plan: SNF    Current Diagnoses: Patient Active Problem List   Diagnosis Date Noted  . Acute metabolic encephalopathy 03/23/2017  . Seizure (HCC) 03/22/2017  . Alzheimer's dementia with behavioral disturbance 03/22/2017  . Diabetes (HCC) 03/22/2017  . Glaucoma 03/22/2017  . Insomnia 03/22/2017  . Hypertension 03/22/2017  . Hyperlipemia 03/22/2017  . Bipolar disorder (HCC) 03/22/2017  . Altered mental status 03/22/2017  . Acute encephalopathy     Orientation RESPIRATION BLADDER Height & Weight     Self, Place  Normal Incontinent, External catheter Weight: 224 lb 3.3 oz (101.7 kg) Height:     BEHAVIORAL SYMPTOMS/MOOD NEUROLOGICAL BOWEL NUTRITION STATUS      Continent Diet  AMBULATORY STATUS COMMUNICATION OF NEEDS Skin   Limited Assist Verbally Normal                       Personal Care Assistance Level of Assistance  Bathing, Dressing Bathing Assistance: Limited assistance   Dressing Assistance: Limited assistance     Functional Limitations Info             SPECIAL CARE FACTORS FREQUENCY  PT (By licensed PT), OT (By licensed OT)     PT Frequency: 5/wk OT Frequency: 5/wk            Contractures      Additional Factors Info  Code Status, Allergies, Psychotropic, Insulin Sliding Scale Code Status Info: FULL Allergies  Info: penicillins Psychotropic Info: abilify Insulin Sliding Scale Info: 3/day       Current Medications (03/24/2017):  This is the current hospital active medication list Current Facility-Administered Medications  Medication Dose Route Frequency Provider Last Rate Last Dose  . acetaminophen (TYLENOL) tablet 650 mg  650 mg Oral Q6H PRN Marcos Eke, PA-C       Or  . acetaminophen (TYLENOL) suppository 650 mg  650 mg Rectal Q6H PRN Marcos Eke, PA-C      . amLODipine (NORVASC) tablet 10 mg  10 mg Oral Daily Dow Adolph N, DO   10 mg at 03/23/17 1804  . ARIPiprazole (ABILIFY) tablet 10 mg  10 mg Oral Daily Marcos Eke, PA-C   10 mg at 03/23/17 6160  . aspirin EC tablet 81 mg  81 mg Oral [address], PA-C   81 mg at 03/24/17 0631  . atorvastatin (LIPITOR) tablet 20 mg  20 mg Oral q1800 Jonah Blue, MD   20 mg at 03/23/17 1701  . bisacodyl (DULCOLAX) suppository 10 mg  10 mg Rectal Daily PRN Marcos Eke, PA-C      . brimonidine (ALPHAGAN) 0.15 % ophthalmic solution 1 drop  1 drop Both Eyes BID Marcos Eke, PA-C   1 drop at 03/23/17 2238  . donepezil (ARICEPT) tablet 5 mg  5 mg Oral QHS  Marcos Eke, PA-C   5 mg at 03/23/17 2232  . finasteride (PROSCAR) tablet 5 mg  5 mg Oral Daily Marcos Eke, PA-C   5 mg at 03/23/17 1610  . heparin injection 5,000 Units  5,000 Units Subcutaneous Q8H Marcos Eke, PA-C   5,000 Units at 03/24/17 0631  . hydrALAZINE (APRESOLINE) injection 5-10 mg  5-10 mg Intravenous Q8H PRN Marcos Eke, PA-C   5 mg at 03/22/17 1826  . insulin aspart (novoLOG) injection 0-9 Units  0-9 Units Subcutaneous TID WC Marcos Eke, PA-C   2 Units at 03/23/17 1224  . latanoprost (XALATAN) 0.005 % ophthalmic solution 1 drop  1 drop Both Eyes QHS Marcos Eke, PA-C   1 drop at 03/23/17 2241  . levETIRAcetam (KEPPRA) 750 mg in sodium chloride 0.9 % 100 mL IVPB  750 mg Intravenous Q12H Milon Dikes, MD   Stopped at 03/24/17 [phone]  .  LORazepam (ATIVAN) injection 1 mg  1 mg Intravenous Q4H PRN Jonah Blue, MD      . mirtazapine (REMERON) tablet 15 mg  15 mg Oral QHS Marcos Eke, PA-C   15 mg at 03/23/17 2232  . ondansetron (ZOFRAN) tablet 4 mg  4 mg Oral Q6H PRN Marcos Eke, PA-C       Or  . ondansetron [facility]) injection 4 mg  4 mg Intravenous Q6H PRN Marcos Eke, PA-C      . sodium chloride (OCEAN) 0.65 % nasal spray 2 spray  2 spray Each Nare Daily Marcos Eke, PA-C   2 spray at 03/23/17 [phone]  . sodium phosphate (FLEET) 7-19 GM/118ML enema 1 enema  1 enema Rectal Once PRN Marcos Eke, PA-C      . tamsulosin (FLOMAX) capsule 0.4 mg  0.4 mg Oral QPC supper Marcos Eke, PA-C   0.4 mg at 03/23/17 1701  . traZODone (DESYREL) tablet 75 mg  75 mg Oral QHS Marcos Eke, PA-C   75 mg at 03/23/17 2232  . valproate (DEPACON) 625 mg in dextrose 5 % 50 mL IVPB  625 mg Intravenous Q12H Milon Dikes, MD   Stopped at 03/23/17 2332     Discharge Medications: Please see discharge summary for a list of discharge medications.  Relevant Imaging Results:  Relevant Lab Results:   Additional Information SS#: [id number]  Burna Sis, LCSW

## 2017-03-24 NOTE — Progress Notes (Signed)
PROGRESS NOTE  Frank Key WUJ:[id number] DOB: [birth year] DOA: 03/22/2017 PCP: Ron Parker, MD  HPI/Recap of past 24 hours: Frank Key is a 77 y.o. male with medical history significant for Alzheimer's dementia, bipolar disorder, diabetes, glaucoma, BPH, hypercholesterolemia, hypertension, history of seizures, resident of a nursing facility brought to the ED for evaluation of altered mental status and seizure-like activity.  Last seen normal prior to going to bed, he initially appeared to have decreased movement in the left upper extremity, was somnolent, Pain after taking Midazolam.He was found on the floor around 4:30 in the morning, and assuming that the patient had a fall due to abrasion in the left forehead, in showing tremors, he was sent to the emergency department for further evaluation.   03/23/2017 patient seen and examined at his bedside.  He is alert however he is confused with baseline dementia.  Spoke with his daughter who was concerned about his returning to his memory facility.  Supposedly not taking his medications there.  RN reports intermittent bradycardia.  EKG reveals first-degree AV block.  Patient is asymptomatic during these episodes.  03/24/2017: Patient seen and examined at his bedside.  He has no new complaints.  He denies any chest pain or dyspnea or palpitations.  He also denies any dizziness or numbness.  Assessment/Plan: Principal Problem:   Altered mental status Active Problems:   Seizure (HCC)   Alzheimer's dementia with behavioral disturbance   Diabetes (HCC)   Glaucoma   Insomnia   Hypertension   Hyperlipemia   Bipolar disorder (HCC)   Acute encephalopathy   Acute metabolic encephalopathy    Acute encephalopathy most likely secondary to post ictal in the setting of noncompliance with antiepileptic drugs Suspect noncompliance with medications Started on IV Keppra and IV Depakote  continue to monitor overnight Follow-up with neurology in the  outpatient setting MRI brain did not reveal any acute intracranial findings Neurology has seen the patient and signed off Continue aspirin and Lipitor for stroke prevention  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 Continue telemetry pain  Severe left ventricular wall thickness up to 2.1 cm with normal LV function Cardiology suspects infiltrative cardiomyopathy such as Ambien reviewed doses versus a centimeter. No plan for surgery at this point. Cardiology following.  Highly appreciated.  Bipolar disorder/depression/anxiety/dementia Continue home meds Reorient as needed  HTN Added amlodipine  continue to monitor  Ambulatory dysfunction PT evaluate and treat  Hx of seizures with suspected medical noncompliance May need SNF placement for assistance with meds  BPH Continue home meds finesteride Monitor urine output    Code Status: Full code.  Family Communication: None at bedside.  Spoke with daughter on 03/23/2017.  Disposition Plan: SNF when medically stable   Consultants:  Case manager  Social worker  Cardiology  Procedures:  None  Antimicrobials:  None  DVT prophylaxis: SCDs, heparin subcu 5000 units 3 times daily   Objective: Vitals:   03/24/17 0500 03/24/17 0736 03/24/17 0859 03/24/17 1134  BP:  (!) 171/71  (!) 173/76  Pulse:  65 74 60  Resp:  19  20  Temp:  98 F (36.7 C)  97.9 F (36.6 C)  TempSrc:  Axillary  Axillary  SpO2:  98%  99%  Weight: 101.7 kg (224 lb 3.3 oz)       Intake/Output Summary (Last 24 hours) at 03/24/2017 1440 Last data filed at 03/24/2017 0736 Gross per 24 hour  Intake 335 ml  Output 1925 ml  Net -1590 ml   Filed Weights   03/24/17 0500  Weight: 101.7 kg (224 lb 3.3 oz)    Exam: 03/24/2017.  Seen and examined.   General: 77 year old African-American male well-developed well-nourished in no acute  distress.  77 year old African-American male well-developed well-nourished in no acute distress.  Cardiovascular: Heart is regular rate and rhythm with no rubs or gallops.    Respiratory: Clear to auscultation with no wheezes or rales.  Abdomen: Obese nontender nondistended normal bowel sounds x4.  Musculoskeletal: No focal deficits noted.  Skin: No rash noted  Psychiatry: Mood is anxious.   Data Reviewed: CBC: Recent Labs  Lab 03/22/17 0508 03/22/17 0533 03/23/17 0730  WBC 8.2  --  7.0  NEUTROABS 4.7  --   --   HGB 12.4* 13.3 13.0  HCT 38.5* 39.0 37.9*  MCV 84.2  --  83.1  PLT 176  --  147*   Basic Metabolic Panel: Recent Labs  Lab 03/22/17 0508 03/22/17 0533 03/23/17 0730 03/23/17 1422  NA 137 140 137  --   K 4.2 4.3 5.3* 3.9  CL 104 106 106  --   CO2 17*  --  21*  --   GLUCOSE 195* 195* 92  --   BUN 16 20 12   --   CREATININE 1.45* 1.30* 1.19  --   CALCIUM 8.7*  --  8.5*  --    GFR: CrCl cannot be calculated (Unknown ideal weight.). Liver Function Tests: Recent Labs  Lab 03/22/17 0508  AST 34  ALT 19  ALKPHOS 73  BILITOT <0.1*  PROT 6.3*  ALBUMIN 3.5   No results for input(s): LIPASE, AMYLASE in the last 168 hours. No results for input(s): AMMONIA in the last 168 hours. Coagulation Profile: Recent Labs  Lab 03/22/17 0508  INR 1.08   Cardiac Enzymes: No results for input(s): CKTOTAL, CKMB, CKMBINDEX, TROPONINI in the last 168 hours. BNP (last 3 results) No results for input(s): PROBNP in the last 8760 hours. HbA1C: Recent Labs    03/22/17 1145  HGBA1C 6.2*   CBG: Recent Labs  Lab 03/23/17 1128 03/23/17 1653 03/23/17 2150 03/24/17 0604 03/24/17 1151  GLUCAP 154* 87 162* 109* 186*   Lipid Profile: No results for input(s): CHOL, HDL, LDLCALC, TRIG, CHOLHDL, LDLDIRECT in the last 72 hours. Thyroid Function Tests: No results for input(s): TSH, T4TOTAL, FREET4, T3FREE, THYROIDAB in the last 72 hours. Anemia Panel: No results for  input(s): VITAMINB12, FOLATE, FERRITIN, TIBC, IRON, RETICCTPCT in the last 72 hours. Urine analysis:    Component Value Date/Time   COLORURINE YELLOW 03/22/2017 0508   APPEARANCEUR CLEAR 03/22/2017 0508   LABSPEC 1.028 03/22/2017 0508   PHURINE 8.0 03/22/2017 0508   GLUCOSEU NEGATIVE 03/22/2017 0508   HGBUR NEGATIVE 03/22/2017 0508   BILIRUBINUR NEGATIVE 03/22/2017 0508   KETONESUR NEGATIVE 03/22/2017 0508   PROTEINUR 100 (A) 03/22/2017 0508   NITRITE NEGATIVE 03/22/2017 0508   LEUKOCYTESUR NEGATIVE 03/22/2017 0508   Sepsis Labs: @LABRCNTIP (procalcitonin:4,lacticidven:4)  ) Recent Results (from the past 240 hour(s))  MRSA PCR Screening     Status: None   Collection Time: 03/22/17  6:45 PM  Result Value Ref Range Status   MRSA by PCR NEGATIVE NEGATIVE Final    Comment:        The GeneXpert MRSA Assay (FDA approved for NASAL specimens only), is one component of a comprehensive MRSA colonization surveillance program. It is not intended to diagnose MRSA infection nor to  guide or monitor treatment for MRSA infections. Performed at [facility], [address] [place], [place] [address]       Studies: No results found.  Scheduled Meds: . amLODipine  10 mg Oral Daily  . ARIPiprazole  10 mg Oral Daily  . aspirin EC  81 mg Oral BH-q7a  . atorvastatin  20 mg Oral q1800  . brimonidine  1 drop Both Eyes BID  . donepezil  5 mg Oral QHS  . finasteride  5 mg Oral Daily  . heparin  5,000 Units Subcutaneous Q8H  . insulin aspart  0-9 Units Subcutaneous TID WC  . latanoprost  1 drop Both Eyes QHS  . levETIRAcetam  750 mg Oral Q12H  . mirtazapine  15 mg Oral QHS  . sodium chloride  2 spray Each Nare Daily  . tamsulosin  0.4 mg Oral QPC supper  . traZODone  75 mg Oral QHS  . Valproate Sodium  625 mg Oral BID    Continuous Infusions:    LOS: 1 day     Darlin Drop, MD [facility] Pager [phone]  If 7PM-7AM, please contact  night-coverage [email or web address] Password [facility] 03/24/2017, 2:40 PM

## 2017-03-24 NOTE — Progress Notes (Signed)
CSW spoke with both patients dtrs concerning PT recommendation for SNF- they are in agreement that pt would benefit from SNF stay prior to return to ALF level of care.  CSW initiated search and explained that pt would need to stay until Sunday to get 3 night qualifying stay and if MD discharged prior to Sunday that pt would not qualify for SNF stay through Medicare- dtrs expressed understanding  CSW applied for SNF level PASAR- under review due to patient Alzheimer's and bipolar diagnosis- 30 day note and signed fl2 sent for review cannot DC to SNF until PASAR number approved  CSW will continue to follow  Burna Sis, LCSW Clinical Social Worker [phone]

## 2017-03-25 LAB — GLUCOSE, CAPILLARY
GLUCOSE-CAPILLARY: 108 mg/dL — AB (ref 65–99)
GLUCOSE-CAPILLARY: 86 mg/dL (ref 65–99)
GLUCOSE-CAPILLARY: 109 mg/dL — AB (ref 65–99)
Glucose-Capillary: 111 mg/dL — ABNORMAL HIGH (ref 65–99)

## 2017-03-25 NOTE — Plan of Care (Signed)
  Problem: Nutrition: Goal: Dietary intake will improve Outcome: Progressing   Problem: Self-Care: Goal: Ability to communicate needs accurately will improve Outcome: Progressing   Problem: Safety: Goal: Ability to remain free from injury will improve Outcome: Progressing   Problem: Elimination: Goal: Will not experience complications related to bowel motility Outcome: Progressing   Problem: Coping: Goal: Level of anxiety will decrease Outcome: Progressing

## 2017-03-25 NOTE — Progress Notes (Signed)
PROGRESS NOTE  Frank Key UEA:[id number] DOB: [birth year] DOA: 03/22/2017 PCP: Frank Parker, MD  HPI/Recap of past 24 hours: Frank Key is a 77 y.o. male with medical history significant for Alzheimer's dementia, bipolar disorder, diabetes, glaucoma, BPH, hypercholesterolemia, hypertension, history of seizures, resident of a nursing facility brought to the ED for evaluation of altered mental status and seizure-like activity.  Last seen normal prior to going to bed, he initially appeared to have decreased movement in the left upper extremity, was somnolent, Pain after taking Midazolam.He was found on the floor around 4:30 in the morning, and assuming that the patient had a fall due to abrasion in the left forehead, in showing tremors, he was sent to the emergency department for further evaluation.   03/23/2017 patient seen and examined at his bedside.  He is alert however he is confused with baseline dementia.  Spoke with his daughter who was concerned about his returning to his memory facility.  Supposedly not taking his medications there.  RN reports intermittent bradycardia.  EKG reveals first-degree AV block.  Patient is asymptomatic during these episodes.  03/24/2017: Patient seen and examined at his bedside.  He has no new complaints.  He denies any chest pain or dyspnea or palpitations.  He also denies any dizziness or numbness.  03/25/17: no new complaints. Patient denies headache, dizziness, numbness, chest pain or palpitations.  Assessment/Plan: Principal Problem:   Altered mental status Active Problems:   Seizure (HCC)   Alzheimer's dementia with behavioral disturbance   Diabetes (HCC)   Glaucoma   Insomnia   Hypertension   Hyperlipemia   Bipolar disorder (HCC)   Acute encephalopathy   Acute metabolic encephalopathy    Acute encephalopathy most likely secondary to post ictal in the setting of noncompliance with antiepileptic drugs Suspect noncompliance with  medications Started on IV Keppra and IV Depakote  continue to monitor overnight Follow-up with neurology in the outpatient setting MRI brain did not reveal any acute intracranial findings Neurology has seen the patient and signed off-no further workup Continue aspirin and Lipitor for stroke prevention  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 Follow up with cardiology outpatient  Severe left ventricular wall thickness up to 2.1 cm with normal LV function Cardiology suspects infiltrative cardiomyopathy such as Ambien reviewed doses versus a centimeter. No plan for surgery at this point. Cardiology followed.  Bipolar disorder/depression/anxiety/dementia Continue home meds Reorient as needed  HTN Added amlodipine  continue to monitor  Ambulatory dysfunction PT evaluate and treat  Hx of seizures with suspected medical noncompliance Needs SNF placement for assistance with meds  BPH Continue home meds finesteride Monitor urine output    Code Status: Full code.  Family Communication: None at bedside.  Spoke with daughter on 03/23/2017.  Disposition Plan: SNF when clinically stable   Consultants:  Case manager  Social worker  Cardiology  neurology  Procedures:  None  Antimicrobials:  None  DVT prophylaxis: SCDs, heparin subcu 5000 units 3 times daily   Objective: Vitals:   03/25/17 0419 03/25/17 0500 03/25/17 0927 03/25/17 1335  BP: (!) 153/69  (!) 166/92 (!) 169/87  Pulse: 78  76 65  Resp: 18  20 20   Temp: 98.4 F (36.9 C)  97.9 F (36.6 C) 97.6 F (36.4 C)  TempSrc: Oral  Oral Oral  SpO2: 99%  100% 100%  Weight:  68.1 kg (150 lb 2.1 oz)  Height:        Intake/Output Summary (Last 24 hours) at 03/25/2017 1346 Last data filed at 03/25/2017 1338 Gross per 24 hour  Intake 960 ml  Output 1325 ml  Net -365 ml   Filed Weights    03/24/17 0500 03/24/17 1718 03/25/17 0500  Weight: 101.7 kg (224 lb 3.3 oz) 68.1 kg (150 lb 2.1 oz) 68.1 kg (150 lb 2.1 oz)    Exam: 03/25/2017.  Seen and examined.   General: 77 yo AAM WD WN NAD A&O.  Cardiovascular: RRR no rubs or gallops  Respiratory: CTA no wheezes or rales  Abdomen: Obese nontender nondistended normal bowel sounds x4.  Musculoskeletal: No focal deficits noted.  Skin: No rash noted  Psychiatry: mood is appropriate for condition    Data Reviewed: CBC: Recent Labs  Lab 03/22/17 0508 03/22/17 0533 03/23/17 0730  WBC 8.2  --  7.0  NEUTROABS 4.7  --   --   HGB 12.4* 13.3 13.0  HCT 38.5* 39.0 37.9*  MCV 84.2  --  83.1  PLT 176  --  147*   Basic Metabolic Panel: Recent Labs  Lab 03/22/17 0508 03/22/17 0533 03/23/17 0730 03/23/17 1422  NA 137 140 137  --   K 4.2 4.3 5.3* 3.9  CL 104 106 106  --   CO2 17*  --  21*  --   GLUCOSE 195* 195* 92  --   BUN 16 20 12   --   CREATININE 1.45* 1.30* 1.19  --   CALCIUM 8.7*  --  8.5*  --    GFR: Estimated Creatinine Clearance: 45.1 mL/min (by C-G formula based on SCr of 1.19 mg/dL). Liver Function Tests: Recent Labs  Lab 03/22/17 0508  AST 34  ALT 19  ALKPHOS 73  BILITOT <0.1*  PROT 6.3*  ALBUMIN 3.5   No results for input(s): LIPASE, AMYLASE in the last 168 hours. No results for input(s): AMMONIA in the last 168 hours. Coagulation Profile: Recent Labs  Lab 03/22/17 0508  INR 1.08   Cardiac Enzymes: No results for input(s): CKTOTAL, CKMB, CKMBINDEX, TROPONINI in the last 168 hours. BNP (last 3 results) No results for input(s): PROBNP in the last 8760 hours. HbA1C: No results for input(s): HGBA1C in the last 72 hours. CBG: Recent Labs  Lab 03/24/17 1151 03/24/17 1801 03/24/17 2120 03/25/17 0618 03/25/17 1139  GLUCAP 186* 128* 155* 108* 111*   Lipid Profile: No results for input(s): CHOL, HDL, LDLCALC, TRIG, CHOLHDL, LDLDIRECT in the last 72 hours. Thyroid Function Tests: No  results for input(s): TSH, T4TOTAL, FREET4, T3FREE, THYROIDAB in the last 72 hours. Anemia Panel: No results for input(s): VITAMINB12, FOLATE, FERRITIN, TIBC, IRON, RETICCTPCT in the last 72 hours. Urine analysis:    Component Value Date/Time   COLORURINE YELLOW 03/22/2017 0508   APPEARANCEUR CLEAR 03/22/2017 0508   LABSPEC 1.028 03/22/2017 0508   PHURINE 8.0 03/22/2017 0508   GLUCOSEU NEGATIVE 03/22/2017 0508   HGBUR NEGATIVE 03/22/2017 0508   BILIRUBINUR NEGATIVE 03/22/2017 0508   KETONESUR NEGATIVE 03/22/2017 0508   PROTEINUR 100 (A) 03/22/2017 0508   NITRITE NEGATIVE 03/22/2017 0508   LEUKOCYTESUR NEGATIVE 03/22/2017 0508   Sepsis Labs: @LABRCNTIP (procalcitonin:4,lacticidven:4)  ) Recent Results (from the past 240 hour(s))  MRSA PCR Screening     Status: None   Collection Time: 03/22/17  6:45 PM  Result Value Ref Range Status   MRSA by PCR NEGATIVE NEGATIVE Final    Comment:        The GeneXpert  MRSA Assay (FDA approved for NASAL specimens only), is one component of a comprehensive MRSA colonization surveillance program. It is not intended to diagnose MRSA infection nor to guide or monitor treatment for MRSA infections. Performed at [facility], [address], [place], [place] [address]       Studies: No results found.  Scheduled Meds: . amLODipine  10 mg Oral Daily  . ARIPiprazole  10 mg Oral Daily  . aspirin EC  81 mg Oral BH-q7a  . atorvastatin  20 mg Oral q1800  . brimonidine  1 drop Both Eyes BID  . donepezil  5 mg Oral QHS  . finasteride  5 mg Oral Daily  . heparin  5,000 Units Subcutaneous Q8H  . insulin aspart  0-9 Units Subcutaneous TID WC  . latanoprost  1 drop Both Eyes QHS  . levETIRAcetam  750 mg Oral Q12H  . mirtazapine  15 mg Oral QHS  . sodium chloride  2 spray Each Nare Daily  . tamsulosin  0.4 mg Oral QPC supper  . traZODone  75 mg Oral QHS  . Valproate Sodium  625 mg Oral BID    Continuous Infusions:    LOS: 2 days      Darlin Drop, MD [facility] Pager [phone]  If 7PM-7AM, please contact night-coverage [email or web address] Password [facility] 03/25/2017, 1:46 PM

## 2017-03-26 LAB — GLUCOSE, CAPILLARY
GLUCOSE-CAPILLARY: 134 mg/dL — AB (ref 65–99)
Glucose-Capillary: 127 mg/dL — ABNORMAL HIGH (ref 65–99)
Glucose-Capillary: 107 mg/dL — ABNORMAL HIGH (ref 65–99)
Glucose-Capillary: 175 mg/dL — ABNORMAL HIGH (ref 65–99)

## 2017-03-26 NOTE — Plan of Care (Signed)
progressing 

## 2017-03-26 NOTE — Progress Notes (Signed)
PROGRESS NOTE  Frank Key:[id number] DOB: 04/15/1940 DOA: 03/22/2017 PCP: Ron Parker, MD  HPI/Recap of past 24 hours: Frank Key is a 77 y.o. male with medical history significant for Alzheimer's dementia, bipolar disorder, diabetes, glaucoma, BPH, hypercholesterolemia, hypertension, history of seizures, resident of a nursing facility brought to the ED for evaluation of altered mental status and seizure-like activity.  Last seen normal prior to going to bed, he initially appeared to have decreased movement in the left upper extremity, was somnolent, Pain after taking Midazolam.He was found on the floor around 4:30 in the morning, and assuming that the patient had a fall due to abrasion in the left forehead, in showing tremors, he was sent to the emergency department for further evaluation.   03/23/2017 patient seen and examined at his bedside.  He is alert however he is confused with baseline dementia.  Spoke with his daughter who was concerned about his returning to his memory facility.  Supposedly not taking his medications there.  RN reports intermittent bradycardia.  EKG reveals first-degree AV block.  Patient is asymptomatic during these episodes.  03/24/2017: Patient seen and examined at his bedside.  He has no new complaints.  He denies any chest pain or dyspnea or palpitations.  He also denies any dizziness or numbness.  03/25/17: no new complaints. Patient denies headache, dizziness, numbness, chest pain or palpitations.  03/26/17: no new complaints. Denies chest pain, dysnea or palpitations.  Assessment/Plan: Principal Problem:   Altered mental status Active Problems:   Seizure (HCC)   Alzheimer's dementia with behavioral disturbance   Diabetes (HCC)   Glaucoma   Insomnia   Hypertension   Hyperlipemia   Bipolar disorder (HCC)   Acute encephalopathy   Acute metabolic encephalopathy    Acute encephalopathy most likely secondary to post ictal in the setting of  noncompliance with antiepileptic drugs Suspect noncompliance with medications Started on IV Keppra and IV Depakote  continue to monitor overnight Follow-up with neurology in the outpatient setting MRI brain did not reveal any acute intracranial findings Neurology has seen the patient and signed off-no further workup Continue aspirin and Lipitor for stroke prevention  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 Follow up with cardiology outpatient  Severe left ventricular wall thickness up to 2.1 cm with normal LV function Cardiology suspects infiltrative cardiomyopathy such as Ambien reviewed doses versus a centimeter. No plan for surgery at this point. Cardiology followed. Follow up with cardiology outpatient  Bipolar disorder/depression/anxiety/dementia Continue home meds Reorient as needed  HTN Added amlodipine  continue to monitor  Ambulatory dysfunction PT evaluate and treat  Hx of seizures with suspected medical noncompliance Needs SNF placement for assistance with meds  BPH Continue home meds finesteride Monitor urine output    Code Status: Full code.  Family Communication: None at bedside.  Spoke with daughter on 03/23/2017.  Disposition Plan: SNF when clinically stable   Consultants:  Case manager  Social worker  Cardiology  neurology  Procedures:  None  Antimicrobials:  None  DVT prophylaxis: SCDs, heparin subcu 5000 units 3 times daily   Objective: Vitals:   03/25/17 2018 03/26/17 0100 03/26/17 0500 03/26/17 1135  BP: (!) 165/94 (!) 138/93 (!) 152/76 128/81  Pulse: 75 79 (!) 101 61  Resp: 18 18 19    Temp: 98.6 F (37 C) 98.6 F (37 C) 98.3 F (36.8 C) 97.8 F (36.6 C)  TempSrc: Oral Oral  Oral Oral  SpO2: 100% 100% 99% 99%  Weight:      Height:        Intake/Output Summary (Last 24 hours) at 03/26/2017 1359 Last data  filed at 03/26/2017 0500 Gross per 24 hour  Intake 240 ml  Output 1100 ml  Net -860 ml   Filed Weights   03/24/17 0500 03/24/17 1718 03/25/17 0500  Weight: 101.7 kg (224 lb 3.3 oz) 68.1 kg (150 lb 2.1 oz) 68.1 kg (150 lb 2.1 oz)    Exam: 03/26/2017.  Seen and examined.   General: 77 yo AAM WD WN NAD A&O x 3  Cardiovascular: RRR no rubs or gallops  Respiratory: CTA no wheezes or rales  Abdomen: Obese NT ND NBS x4  Musculoskeletal: No focal deficits noted.  Skin: No rash noted  Psychiatry: mood is appropriate for condition    Data Reviewed: CBC: Recent Labs  Lab 03/22/17 0508 03/22/17 0533 03/23/17 0730  WBC 8.2  --  7.0  NEUTROABS 4.7  --   --   HGB 12.4* 13.3 13.0  HCT 38.5* 39.0 37.9*  MCV 84.2  --  83.1  PLT 176  --  147*   Basic Metabolic Panel: Recent Labs  Lab 03/22/17 0508 03/22/17 0533 03/23/17 0730 03/23/17 1422  NA 137 140 137  --   K 4.2 4.3 5.3* 3.9  CL 104 106 106  --   CO2 17*  --  21*  --   GLUCOSE 195* 195* 92  --   BUN 16 20 12   --   CREATININE 1.45* 1.30* 1.19  --   CALCIUM 8.7*  --  8.5*  --    GFR: Estimated Creatinine Clearance: 45.1 mL/min (by C-G formula based on SCr of 1.19 mg/dL). Liver Function Tests: Recent Labs  Lab 03/22/17 0508  AST 34  ALT 19  ALKPHOS 73  BILITOT <0.1*  PROT 6.3*  ALBUMIN 3.5   No results for input(s): LIPASE, AMYLASE in the last 168 hours. No results for input(s): AMMONIA in the last 168 hours. Coagulation Profile: Recent Labs  Lab 03/22/17 0508  INR 1.08   Cardiac Enzymes: No results for input(s): CKTOTAL, CKMB, CKMBINDEX, TROPONINI in the last 168 hours. BNP (last 3 results) No results for input(s): PROBNP in the last 8760 hours. HbA1C: No results for input(s): HGBA1C in the last 72 hours. CBG: Recent Labs  Lab 03/25/17 1139 03/25/17 1613 03/25/17 2147 03/26/17 0648 03/26/17 1144  GLUCAP 111* 86 109* 127* 134*   Lipid Profile: No results for input(s): CHOL, HDL, LDLCALC,  TRIG, CHOLHDL, LDLDIRECT in the last 72 hours. Thyroid Function Tests: No results for input(s): TSH, T4TOTAL, FREET4, T3FREE, THYROIDAB in the last 72 hours. Anemia Panel: No results for input(s): VITAMINB12, FOLATE, FERRITIN, TIBC, IRON, RETICCTPCT in the last 72 hours. Urine analysis:    Component Value Date/Time   COLORURINE YELLOW 03/22/2017 0508   APPEARANCEUR CLEAR 03/22/2017 0508   LABSPEC 1.028 03/22/2017 0508   PHURINE 8.0 03/22/2017 0508   GLUCOSEU NEGATIVE 03/22/2017 0508   HGBUR NEGATIVE 03/22/2017 0508   BILIRUBINUR NEGATIVE 03/22/2017 0508   KETONESUR NEGATIVE 03/22/2017 0508   PROTEINUR 100 (A) 03/22/2017 0508   NITRITE NEGATIVE 03/22/2017 0508   LEUKOCYTESUR NEGATIVE 03/22/2017 0508   Sepsis Labs: @LABRCNTIP (procalcitonin:4,lacticidven:4)  ) Recent Results (from the past 240 hour(s))  MRSA PCR Screening     Status: None   Collection Time: 03/22/17  6:45 PM  Result Value Ref Range Status   MRSA by PCR NEGATIVE  NEGATIVE Final    Comment:        The GeneXpert MRSA Assay (FDA approved for NASAL specimens only), is one component of a comprehensive MRSA colonization surveillance program. It is not intended to diagnose MRSA infection nor to guide or monitor treatment for MRSA infections. Performed at [facility], [address] [place], [place] [address]       Studies: No results found.  Scheduled Meds: . amLODipine  10 mg Oral Daily  . ARIPiprazole  10 mg Oral Daily  . aspirin EC  81 mg Oral BH-q7a  . atorvastatin  20 mg Oral q1800  . brimonidine  1 drop Both Eyes BID  . donepezil  5 mg Oral QHS  . finasteride  5 mg Oral Daily  . heparin  5,000 Units Subcutaneous Q8H  . insulin aspart  0-9 Units Subcutaneous TID WC  . latanoprost  1 drop Both Eyes QHS  . levETIRAcetam  750 mg Oral Q12H  . mirtazapine  15 mg Oral QHS  . sodium chloride  2 spray Each Nare Daily  . tamsulosin  0.4 mg Oral QPC supper  . traZODone  75 mg Oral QHS  .  Valproate Sodium  625 mg Oral BID    Continuous Infusions:    LOS: 3 days     Darlin Drop, MD [facility] Pager [phone]  If 7PM-7AM, please contact night-coverage [email or web address] Password [facility] 03/26/2017, 1:59 PM

## 2017-03-27 DIAGNOSIS — G4700 Insomnia, unspecified: Secondary | ICD-10-CM

## 2017-03-27 LAB — GLUCOSE, CAPILLARY
GLUCOSE-CAPILLARY: 95 mg/dL (ref 65–99)
Glucose-Capillary: 120 mg/dL — ABNORMAL HIGH (ref 65–99)
Glucose-Capillary: 149 mg/dL — ABNORMAL HIGH (ref 65–99)

## 2017-03-27 MED ORDER — LEVETIRACETAM 100 MG/ML PO SOLN: 750.0000 mg | Freq: Two times a day (BID) | ORAL | 0 refills | Status: AC

## 2017-03-27 NOTE — Discharge Instructions (Signed)
Seizure, Adult °When you have a seizure: °· Parts of your body may move. °· How aware or awake (conscious) you are may change. °· You may shake (convulse). ° °Some people have symptoms right before a seizure happens. These symptoms may include: °· Fear. °· Worry (anxiety). °· Feeling like you are going to throw up (nausea). °· Feeling like the room is spinning (vertigo). °· Feeling like you saw or heard something before (deja vu). °· Odd tastes or smells. °· Changes in vision, such as seeing flashing lights or spots. ° °Seizures usually last from 30 seconds to 2 minutes. Usually, they are not harmful unless they last a long time. °Follow these instructions at home: °Medicines °· Take over-the-counter and prescription medicines only as told by your doctor. °· Avoid anything that may keep your medicine from working, such as alcohol. °Activity °· Do not do any activities that would be dangerous if you had another seizure, like driving or swimming. Wait until your doctor approves. °· If you live in the [place] ask your local DMV (department of motor vehicles) when you can drive. °· Rest. °Teaching others °· Teach friends and family what to do when you have a seizure. They should: °? Lay you on the ground. °? Protect your head and body. °? Loosen any tight clothing around your neck. °? Turn you on your side. °? Stay with you until you are better. °? Not hold you down. °? Not put anything in your mouth. °? Know whether or not you need emergency care. °General instructions °· Contact your doctor each time you have a seizure. °· Avoid anything that gives you seizures. °· Keep a seizure diary. Write down: °? What you think caused each seizure. °? What you remember about each seizure. °· Keep all follow-up visits as told by your doctor. This is important. °Contact a doctor if: °· You have another seizure. °· You have seizures more often. °· There is any change in what happens during your seizures. °· You continue to have  seizures with treatment. °· You have symptoms of being sick or having an infection. °Get help right away if: °· You have a seizure: °? That lasts longer than 5 minutes. °? That is different than seizures you had before. °? That makes it harder to breathe. °? After you hurt your head. °· After a seizure, you cannot speak or use a part of your body. °· After a seizure, you are confused or have a bad headache. °· You have two or more seizures in a row. °· You are having seizures more often. °· You do not wake up right after a seizure. °· You get hurt during a seizure. °In an emergency: °· These symptoms may be an emergency. Do not wait to see if the symptoms will go away. Get medical help right away. Call your local emergency services (911 in the [place] Do not drive yourself to the hospital. °This information is not intended to replace advice given to you by your health care provider. Make sure you discuss any questions you have with your health care provider. °Document Released: 06/08/2007 Document Revised: 09/02/2015 Document Reviewed: 09/02/2015 °Elsevier Interactive Patient Education © 2017 [employer] ° °

## 2017-03-27 NOTE — Progress Notes (Signed)
atempted report x1 to [facility] , no one answer phone at their nursing station.

## 2017-03-27 NOTE — Care Management Important Message (Signed)
Important Message  Patient Details  Name: Frank Key MRN: [id number] Date of Birth: 11-28-1940   Medicare Important Message Given:  Yes    Dorena Bodo 03/27/2017, 2:46 PM

## 2017-03-27 NOTE — Discharge Summary (Addendum)
Discharge Summary  Shlomo Seres ZOX:[id number] DOB: 01/20/1940  PCP: Ron Parker, MD  Admit date: 03/22/2017 Discharge date: 03/27/2017  Time spent: 25 minutes  Recommendations for Outpatient Follow-up:  1. Follow-up with cardiology 2. Follow-up with PCP 3. Continue physical rehab 4. Fall precautions 5. Take your medications as prescribed  Discharge Diagnoses:  Active Hospital Problems   Diagnosis Date Noted  . Altered mental status 03/22/2017  . Acute metabolic encephalopathy 03/23/2017  . Seizure (HCC) 03/22/2017  . Alzheimer's dementia with behavioral disturbance 03/22/2017  . Diabetes (HCC) 03/22/2017  . Glaucoma 03/22/2017  . Insomnia 03/22/2017  . Hypertension 03/22/2017  . Hyperlipemia 03/22/2017  . Bipolar disorder (HCC) 03/22/2017  . Acute encephalopathy     Resolved Hospital Problems  No resolved problems to display.    Discharge Condition: Stable  Diet recommendation: Resume previous diet  Vitals:   03/27/17 0323 03/27/17 0800  BP: 139/82 110/78  Pulse: 61 66  Resp: 18 20  Temp: 98.5 F (36.9 C) 98.2 F (36.8 C)  SpO2: 99% 100%    History of present illness:  Frank Key a 77 y.o.malewith medical history significant forAlzheimer's dementia, bipolar disorder, diabetes, glaucoma, BPH, hypercholesterolemia, hypertension, history of seizures, resident of a nursing facility brought to the ED for evaluation of altered mental status and seizure-like activity. Last seen normal prior to going to bed, he initially appeared to have decreased movement in the left upper extremity, was somnolent, Pain after taking Midazolam.Hewas found on the floor around 4:30 in the morning, and assuming that the patient had a fall due to abrasion in the left forehead, in showing tremors, he was sent to the emergency department for further evaluation.   03/23/2017 patient seen and examined at his bedside.  He is alert however he is confused.  Spoke with his daughter who  was concerned about his returning to the memory facility.  Supposedly not taking his medications there.  RN reports intermittent bradycardia.  EKG reveals first-degree AV block.  Patient is asymptomatic during these episodes.  Cardiology consulted on 03/24/2017.  An echocardiogram revealed severe left ventricular wall thickness up to 2.1 cm with normal LV function as reported by Dr. Benay Pike.  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  Cardiology recommends observation at this time and to consider outpatient monitoring.  The rest of the patient's hospitalization was uneventful.  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  He will need to follow-up with cardiology and PCP in the outpatient setting.  He will also need to continue physical rehab.  Hospital Course:  Principal Problem:   Altered mental status Active Problems:   Seizure (HCC)   Alzheimer's dementia with behavioral disturbance   Diabetes (HCC)   Glaucoma   Insomnia   Hypertension   Hyperlipemia   Bipolar disorder (HCC)   Acute encephalopathy   Acute metabolic encephalopathy  Acute metabolic encephalopathy most likely secondary to post ictal in the setting of noncompliance with antiepileptic drugs Suspect noncompliance with medications Started on IV Keppra and IV Depakote  continue to monitor overnight Follow-up with neurology in the outpatient setting MRI brain did not reveal any acute intracranial findings Neurology has seen the patient and signed off-no further stroke workup Continue aspirin and Lipitor for stroke prevention  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 Follow up  with cardiology outpatient  Severe left ventricular wall thickness up to  2.1 cm with normal LV function Cardiology suspects infiltrative cardiomyopathy such as Ambien reviewed doses versus a centimeter. No plan for surgery at this point. Cardiology followed. Follow up with cardiology outpatient  Bipolar disorder/depression/anxiety/dementia Continue home meds Reorient as needed  HTN BP stable Added amlodipine   Ambulatory dysfunction PT evaluate and treat  Hx of seizures with suspected medical noncompliance Needs SNF placement for assistance with meds  BPH Continue home meds finesteride Monitor urine output    Procedures:  None  Consultations:  Cardiology  Discharge Exam: BP 110/78 (BP Location: Right Arm)   Pulse 66   Temp 98.2 F (36.8 C) (Oral)   Resp 20   Ht 5\' 1"  (1.549 m)   Wt 89 kg (196 lb 3.4 oz)   SpO2 100%   BMI 37.07 kg/m   General: 77 year old African-American male well-developed well-nourished no acute distress. Cardiovascular: Regular rate and rhythm with no rubs or gallops. Respiratory: Clear to auscultation with no wheezes or rales.  Discharge Instructions You were cared for by a hospitalist during your hospital stay. If you have any questions about your discharge medications or the care you received while you were in the hospital after you are discharged, you can call the unit and asked to speak with the hospitalist on call if the hospitalist that took care of you is not available. Once you are discharged, your primary care physician will handle any further medical issues. Please note that NO REFILLS for any discharge medications will be authorized once you are discharged, as it is imperative that you return to your primary care physician (or establish a relationship with a primary care physician if you do not have one) for your aftercare needs so that they can reassess your need for medications and monitor your lab values.   Allergies as of 03/27/2017      Reactions   Penicillins Other (See Comments)    Unknown Rxn      Medication List    STOP taking these medications   acetaminophen 325 MG tablet Commonly known as:  TYLENOL   acetaminophen 500 MG tablet Commonly known as:  TYLENOL   guaifenesin 100 MG/5ML syrup Commonly known as:  ROBITUSSIN   losartan 50 MG tablet Commonly known as:  COZAAR   magnesium hydroxide 400 MG/5ML suspension Commonly known as:  MILK OF MAGNESIA   mirtazapine 15 MG tablet Commonly known as:  REMERON   polyethylene glycol packet Commonly known as:  MIRALAX / GLYCOLAX   senna-docusate 8.6-50 MG tablet Commonly known as:  Senokot-S   sodium chloride 0.65 % Soln nasal spray Commonly known as:  OCEAN     TAKE these medications   amLODipine 10 MG tablet Commonly known as:  NORVASC Take 10 mg by mouth daily.   ARIPiprazole 10 MG tablet Commonly known as:  ABILIFY Take 10 mg by mouth daily.   aspirin EC 81 MG tablet Take 81 mg by mouth every morning.   atorvastatin 20 MG tablet Commonly known as:  LIPITOR Take 20 mg by mouth daily.   bisacodyl 10 MG suppository Commonly known as:  DULCOLAX Place 10 mg rectally daily as needed for moderate constipation.   brimonidine 0.2 % ophthalmic solution Commonly known as:  ALPHAGAN Place 1 drop into the right eye 2 (two) times daily. What changed:  Another medication with the same name was removed. Continue taking this medication, and follow the directions you see here.  brinzolamide 1 % ophthalmic suspension Commonly known as:  AZOPT Place 1 drop into both eyes 2 (two) times daily.   donepezil 5 MG tablet Commonly known as:  ARICEPT Take 5 mg by mouth at bedtime.   finasteride 5 MG tablet Commonly known as:  PROSCAR Take 5 mg by mouth daily.   levETIRAcetam 100 MG/ML solution Commonly known as:  KEPPRA Take 7.5 mLs (750 mg total) by mouth every 12 (twelve) hours. What changed:  when to take this   Melatonin 3 MG Tabs Take 6 mg by mouth at bedtime.   MINERIN Crea Apply 1  application topically at bedtime.   neomycin-bacitracin-polymyxin Oint Commonly known as:  NEOSPORIN Apply 1 application topically daily as needed for irritation or wound care.   tamsulosin 0.4 MG Caps capsule Commonly known as:  FLOMAX Take 0.4 mg by mouth daily after supper.   Travoprost (BAK Free) 0.004 % Soln ophthalmic solution Commonly known as:  TRAVATAN Place 1 drop into both eyes at bedtime.   traZODone 150 MG tablet Commonly known as:  DESYREL Take 75 mg by mouth at bedtime.   Valproate Sodium 250 MG/5ML Soln solution Commonly known as:  DEPAKENE Take 625 mg by mouth 2 (two) times daily.      Allergies  Allergen Reactions  . Penicillins Other (See Comments)    Unknown Rxn    Follow-up Information    Hilty, Lisette Abu, MD Follow up in 1 week(s).   Specialty:  Cardiology Contact information: [address] [place] [address] [phone]        Milon Dikes, MD Follow up in 1 week(s).   Specialty:  Neurology Contact information: [address] [place] [address] [phone]        Ron Parker, MD Follow up in 4 day(s).   Specialty:  Internal Medicine Contact information: [address] [place] [address] [phone]            The results of significant diagnostics from this hospitalization (including imaging, microbiology, ancillary and laboratory) are listed below for reference.    Significant Diagnostic Studies: Ct Angio Head W Or Wo Contrast  Result Date: 03/22/2017 EXAM: CT ANGIOGRAPHY HEAD AND NECK CT PERFUSION BRAIN TECHNIQUE: Multidetector CT imaging of the head and neck was performed using the standard protocol during bolus administration of intravenous contrast. Multiplanar CT image reconstructions and MIPs were obtained to evaluate the vascular anatomy. Carotid stenosis measurements (when applicable) are obtained utilizing NASCET criteria, using the distal internal carotid diameter as the  denominator. Multiphase CT imaging of the brain was performed following IV bolus contrast injection. Subsequent parametric perfusion maps were calculated using RAPID software. CONTRAST:  ISOVUE-370 IOPAMIDOL (ISOVUE-370) INJECTION 76% COMPARISON:  None. FINDINGS: CTA NECK FINDINGS Aortic arch: Visualized aortic arch of normal caliber with normal branch pattern. No high-grade stenosis about the origin of the great vessels. Visualized subclavian arteries widely patent. Right carotid system: Right common carotid artery demonstrates scattered atheromatous irregularity without flow-limiting stenosis. There is a severe near occlusive stenosis involving the proximal right ICA (series 7, image 206). String sign present. Stenosis measures approximately 4 mm in length. Right ICA patent distally to the skull base without additional stenosis, dissection, or occlusion. Left carotid system: Left common carotid artery tortuous proximally. A centric plaque within the mid-distal left common carotid artery without significant stenosis. Calcified plaque about the left carotid bifurcation/proximal left ICA associated stenosis of up to approximately 50% by NASCET criteria. Left ICA patent  distally to the skull base without additional stenosis, dissection, or occlusion. Vertebral arteries: Both of the vertebral arteries arise from the subclavian arteries. Right vertebral artery occluded at its origin. Left vertebral artery patent within the neck without stenosis, dissection, or occlusion. Skeleton: Mild exaggeration of the normal cervical lordosis. Trace retrolisthesis of C5 on C6. No other listhesis or malalignment. Skull base intact. Normal C1-2 articulations are preserved in the dens is intact. Vertebral body heights maintained. No acute fracture. No abnormal prevertebral edema. Mild to moderate degenerate spondylolysis present at C3-4 through C6-7. Other neck: Soft tissues of the neck demonstrate no other acute abnormality.  Salivary glands within normal limits. Thyroid normal. No adenopathy. Upper chest: Visualized upper chest demonstrates no acute abnormality. Partially visualized lungs are grossly clear. Review of the MIP images confirms the above findings CTA HEAD FINDINGS Anterior circulation: Petrous right ICA patent proximally. There is a focal severe stenosis at the distal petrous/cavernous junction on the right (series 7, image 121). Extensive atheromatous disease throughout the cavernous/supraclinoid right ICA with moderate to severe diffuse narrowing. Right ICA is patent to the terminus. On the left, the petrous left ICA patent without high-grade stenosis. Extensive atheromatous plaque throughout the cavernous/supraclinoid left ICA with moderate to severe narrowing as well. Left ICA terminus patent. Multifocal moderate to severe stenoses present within the A1 segments bilaterally. Patent anterior communicating artery. Anterior cerebral arteries demonstrate extensive atheromatous irregularity with multifocal severe stenoses, right worse than left, but are patent to their distal aspects. Multifocal moderate to severe proximal and mid right M1 stenoses (series 7, image 92, 90). There are additional multifocal severe proximal right M2 stenoses. Right MCA branches are perfused distally, although demonstrate extensive atheromatous irregularity. Diffuse atheromatous irregularity throughout the left M1 segment with more mild to moderate multifocal narrowing at the mid aspect. Extensive atheromatous irregularity throughout the left MCA branches, which are perfused to their distal aspects. Posterior circulation: Extensive atheromatous irregularity with moderate to severe stenoses involving the mid and distal left V4 segment. Left PICA is patent. Right vertebral artery occluded. There is severe near occlusive stenosis at the vertebrobasilar junction/proximal basilar artery (series 7, image 135). Scant thready flow seen distally within  the basilar artery with multifocal moderate to severe stenoses. Basilar is patent to its distal aspect. Superior cerebral arteries patent bilaterally. Right PCA supplied via the basilar. Predominant fetal type origin of the left PCA supplied via a patent and irregular left P com. Extensive atheromatous irregularity throughout the PCAs bilaterally with multifocal severe stenoses. PCAs are patent to their distal aspects. Venous sinuses: Grossly patent, although not well evaluated due to arterial timing of the contrast bolus Anatomic variants: Fetal type origin of the left PCA with underlying diminutive vertebrobasilar system. No aneurysm. Delayed phase: Not performed. Review of the MIP images confirms the above findings CT Brain Perfusion Findings: CBF (<30%) Volume: 0mL Perfusion (Tmax>6.0s) volume: 0mL Mismatch Volume: 0mL Infarction Location:No cerebral infarct by perfusion. Mildly elevated mean transit time in T-max within the right cerebral hemisphere most likely related to noted right ICA stenosis. IMPRESSION: 1. Negative CTA for emergent large vessel occlusion. No evidence for acute infarct by CT perfusion. 2. 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 3. 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 4. 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 Findings are associated with near occlusive stenosis at the proximal basilar artery, with markedly attenuated  with irregular thready flow distally. 5. No acute abnormality within the cervical spine.  No fracture. Critical Value/emergent results were called by telephone at the time of interpretation on 03/22/2017 at 5:50 am to Dr. Ross Marcus ; [facility] , who verbally acknowledged these results. Electronically Signed   By: Rise Mu M.D.   On: 03/22/2017 06:36    Dg Chest 1 View  Result Date: 03/22/2017 CLINICAL DATA:  First degree AV block. EXAM: CHEST  1 VIEW COMPARISON:  None. FINDINGS: Cardiomegaly. Aortic atherosclerosis. Possible pulmonary venous hypertension but no edema. No effusions. No focal lesion. No significant bone finding. IMPRESSION: Cardiomegaly. Aortic atherosclerosis. Possible venous hypertension without edema. Electronically Signed   By: Paulina Fusi M.D.   On: 03/22/2017 13:57   Dg Abd 1 View  Result Date: 03/22/2017 CLINICAL DATA:  MRI contraindicated due to metal implant. EXAM: ABDOMEN - 1 VIEW COMPARISON:  None. FINDINGS: Bowel-gas pattern is not show any evidence of ileus or obstruction. Normal amount of fecal matter. Urinary tract contrast within the bladder. Surgical clips in the pelvis. No significant bone finding. Metallic densities overlie the lower abdomen. These could be external to the patient or could represent some sort of staple surgical closure. IMPRESSION: Contrast within the bladder. No abnormal bowel finding. Multiple densities overlying the lower abdomen that could be external to the patient or represent some sort of staple surgical closure. Surgical clips along the pelvic sidewall region on the left. Electronically Signed   By: Paulina Fusi M.D.   On: 03/22/2017 13:58   Ct Angio Neck W And/or Wo Contrast  Result Date: 03/22/2017 EXAM: CT ANGIOGRAPHY HEAD AND NECK CT PERFUSION BRAIN TECHNIQUE: Multidetector CT imaging of the head and neck was performed using the standard protocol during bolus administration of intravenous contrast. Multiplanar CT image reconstructions and MIPs were obtained to evaluate the vascular anatomy. Carotid stenosis measurements (when applicable) are obtained utilizing NASCET criteria, using the distal internal carotid diameter as the denominator. Multiphase CT imaging of the brain was performed following IV bolus contrast injection. Subsequent parametric perfusion maps were calculated using  RAPID software. CONTRAST:  ISOVUE-370 IOPAMIDOL (ISOVUE-370) INJECTION 76% COMPARISON:  None. FINDINGS: CTA NECK FINDINGS Aortic arch: Visualized aortic arch of normal caliber with normal branch pattern. No high-grade stenosis about the origin of the great vessels. Visualized subclavian arteries widely patent. Right carotid system: Right common carotid artery demonstrates scattered atheromatous irregularity without flow-limiting stenosis. There is a severe near occlusive stenosis involving the proximal right ICA (series 7, image 206). String sign present. Stenosis measures approximately 4 mm in length. Right ICA patent distally to the skull base without additional stenosis, dissection, or occlusion. Left carotid system: Left common carotid artery tortuous proximally. A centric plaque within the mid-distal left common carotid artery without significant stenosis. Calcified plaque about the left carotid bifurcation/proximal left ICA associated stenosis of up to approximately 50% by NASCET criteria. Left ICA patent distally to the skull base without additional stenosis, dissection, or occlusion. Vertebral arteries: Both of the vertebral arteries arise from the subclavian arteries. Right vertebral artery occluded at its origin. Left vertebral artery patent within the neck without stenosis, dissection, or occlusion. Skeleton: Mild exaggeration of the normal cervical lordosis. Trace retrolisthesis of C5 on C6. No other listhesis or malalignment. Skull base intact. Normal C1-2 articulations are preserved in the dens is intact. Vertebral body heights maintained. No acute fracture. No abnormal prevertebral edema. Mild to moderate degenerate spondylolysis present at C3-4 through C6-7. Other neck: Soft  tissues of the neck demonstrate no other acute abnormality. Salivary glands within normal limits. Thyroid normal. No adenopathy. Upper chest: Visualized upper chest demonstrates no acute abnormality. Partially visualized  lungs are grossly clear. Review of the MIP images confirms the above findings CTA HEAD FINDINGS Anterior circulation: Petrous right ICA patent proximally. There is a focal severe stenosis at the distal petrous/cavernous junction on the right (series 7, image 121). Extensive atheromatous disease throughout the cavernous/supraclinoid right ICA with moderate to severe diffuse narrowing. Right ICA is patent to the terminus. On the left, the petrous left ICA patent without high-grade stenosis. Extensive atheromatous plaque throughout the cavernous/supraclinoid left ICA with moderate to severe narrowing as well. Left ICA terminus patent. Multifocal moderate to severe stenoses present within the A1 segments bilaterally. Patent anterior communicating artery. Anterior cerebral arteries demonstrate extensive atheromatous irregularity with multifocal severe stenoses, right worse than left, but are patent to their distal aspects. Multifocal moderate to severe proximal and mid right M1 stenoses (series 7, image 92, 90). There are additional multifocal severe proximal right M2 stenoses. Right MCA branches are perfused distally, although demonstrate extensive atheromatous irregularity. Diffuse atheromatous irregularity throughout the left M1 segment with more mild to moderate multifocal narrowing at the mid aspect. Extensive atheromatous irregularity throughout the left MCA branches, which are perfused to their distal aspects. Posterior circulation: Extensive atheromatous irregularity with moderate to severe stenoses involving the mid and distal left V4 segment. Left PICA is patent. Right vertebral artery occluded. There is severe near occlusive stenosis at the vertebrobasilar junction/proximal basilar artery (series 7, image 135). Scant thready flow seen distally within the basilar artery with multifocal moderate to severe stenoses. Basilar is patent to its distal aspect. Superior cerebral arteries patent bilaterally. Right PCA  supplied via the basilar. Predominant fetal type origin of the left PCA supplied via a patent and irregular left P com. Extensive atheromatous irregularity throughout the PCAs bilaterally with multifocal severe stenoses. PCAs are patent to their distal aspects. Venous sinuses: Grossly patent, although not well evaluated due to arterial timing of the contrast bolus Anatomic variants: Fetal type origin of the left PCA with underlying diminutive vertebrobasilar system. No aneurysm. Delayed phase: Not performed. Review of the MIP images confirms the above findings CT Brain Perfusion Findings: CBF (<30%) Volume: 0mL Perfusion (Tmax>6.0s) volume: 0mL Mismatch Volume: 0mL Infarction Location:No cerebral infarct by perfusion. Mildly elevated mean transit time in T-max within the right cerebral hemisphere most likely related to noted right ICA stenosis. IMPRESSION: 1. Negative CTA for emergent large vessel occlusion. No evidence for acute infarct by CT perfusion. 2. 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 3. 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 4. 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 Findings are associated with near occlusive stenosis at the proximal basilar artery, with markedly attenuated with irregular thready flow distally. 5. No acute abnormality within the cervical spine.  No fracture. Critical Value/emergent results were called by telephone at the time of interpretation on 03/22/2017 at 5:50 am to Dr. Ross Marcus ; [facility] , who verbally acknowledged these results. Electronically Signed   By: Rise Mu M.D.   On: 03/22/2017 06:36   Ct Cervical Spine Wo Contrast  Result Date: 03/22/2017 EXAM: CT ANGIOGRAPHY HEAD AND NECK CT PERFUSION BRAIN TECHNIQUE: Multidetector CT imaging of the head  and neck was performed using the standard protocol during bolus  administration of intravenous contrast. Multiplanar CT image reconstructions and MIPs were obtained to evaluate the vascular anatomy. Carotid stenosis measurements (when applicable) are obtained utilizing NASCET criteria, using the distal internal carotid diameter as the denominator. Multiphase CT imaging of the brain was performed following IV bolus contrast injection. Subsequent parametric perfusion maps were calculated using RAPID software. CONTRAST:  ISOVUE-370 IOPAMIDOL (ISOVUE-370) INJECTION 76% COMPARISON:  None. FINDINGS: CTA NECK FINDINGS Aortic arch: Visualized aortic arch of normal caliber with normal branch pattern. No high-grade stenosis about the origin of the great vessels. Visualized subclavian arteries widely patent. Right carotid system: Right common carotid artery demonstrates scattered atheromatous irregularity without flow-limiting stenosis. There is a severe near occlusive stenosis involving the proximal right ICA (series 7, image 206). String sign present. Stenosis measures approximately 4 mm in length. Right ICA patent distally to the skull base without additional stenosis, dissection, or occlusion. Left carotid system: Left common carotid artery tortuous proximally. A centric plaque within the mid-distal left common carotid artery without significant stenosis. Calcified plaque about the left carotid bifurcation/proximal left ICA associated stenosis of up to approximately 50% by NASCET criteria. Left ICA patent distally to the skull base without additional stenosis, dissection, or occlusion. Vertebral arteries: Both of the vertebral arteries arise from the subclavian arteries. Right vertebral artery occluded at its origin. Left vertebral artery patent within the neck without stenosis, dissection, or occlusion. Skeleton: Mild exaggeration of the normal cervical lordosis. Trace retrolisthesis of C5 on C6. No other listhesis  or malalignment. Skull base intact. Normal C1-2 articulations are preserved in the dens is intact. Vertebral body heights maintained. No acute fracture. No abnormal prevertebral edema. Mild to moderate degenerate spondylolysis present at C3-4 through C6-7. Other neck: Soft tissues of the neck demonstrate no other acute abnormality. Salivary glands within normal limits. Thyroid normal. No adenopathy. Upper chest: Visualized upper chest demonstrates no acute abnormality. Partially visualized lungs are grossly clear. Review of the MIP images confirms the above findings CTA HEAD FINDINGS Anterior circulation: Petrous right ICA patent proximally. There is a focal severe stenosis at the distal petrous/cavernous junction on the right (series 7, image 121). Extensive atheromatous disease throughout the cavernous/supraclinoid right ICA with moderate to severe diffuse narrowing. Right ICA is patent to the terminus. On the left, the petrous left ICA patent without high-grade stenosis. Extensive atheromatous plaque throughout the cavernous/supraclinoid left ICA with moderate to severe narrowing as well. Left ICA terminus patent. Multifocal moderate to severe stenoses present within the A1 segments bilaterally. Patent anterior communicating artery. Anterior cerebral arteries demonstrate extensive atheromatous irregularity with multifocal severe stenoses, right worse than left, but are patent to their distal aspects. Multifocal moderate to severe proximal and mid right M1 stenoses (series 7, image 92, 90). There are additional multifocal severe proximal right M2 stenoses. Right MCA branches are perfused distally, although demonstrate extensive atheromatous irregularity. Diffuse atheromatous irregularity throughout the left M1 segment with more mild to moderate multifocal narrowing at the mid aspect. Extensive atheromatous irregularity throughout the left MCA branches, which are perfused to their distal aspects. Posterior  circulation: Extensive atheromatous irregularity with moderate to severe stenoses involving the mid and distal left V4 segment. Left PICA is patent. Right vertebral artery occluded. There is severe near occlusive stenosis at the vertebrobasilar junction/proximal basilar artery (series 7, image 135). Scant thready flow seen distally within the basilar artery with multifocal moderate to severe stenoses. Basilar is patent to its distal aspect. Superior cerebral arteries patent bilaterally. Right PCA supplied via the basilar.  Predominant fetal type origin of the left PCA supplied via a patent and irregular left P com. Extensive atheromatous irregularity throughout the PCAs bilaterally with multifocal severe stenoses. PCAs are patent to their distal aspects. Venous sinuses: Grossly patent, although not well evaluated due to arterial timing of the contrast bolus Anatomic variants: Fetal type origin of the left PCA with underlying diminutive vertebrobasilar system. No aneurysm. Delayed phase: Not performed. Review of the MIP images confirms the above findings CT Brain Perfusion Findings: CBF (<30%) Volume: 0mL Perfusion (Tmax>6.0s) volume: 0mL Mismatch Volume: 0mL Infarction Location:No cerebral infarct by perfusion. Mildly elevated mean transit time in T-max within the right cerebral hemisphere most likely related to noted right ICA stenosis. IMPRESSION: 1. Negative CTA for emergent large vessel occlusion. No evidence for acute infarct by CT perfusion. 2. 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 3. 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 4. 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 Findings are associated with near occlusive stenosis at the proximal basilar artery, with markedly attenuated with irregular thready flow distally. 5.  No acute abnormality within the cervical spine.  No fracture. Critical Value/emergent results were called by telephone at the time of interpretation on 03/22/2017 at 5:50 am to Dr. Ross Marcus ; [facility] , who verbally acknowledged these results. Electronically Signed   By: Rise Mu M.D.   On: 03/22/2017 06:36   Mr Brain Wo Contrast  Addendum Date: 03/23/2017   ADDENDUM REPORT: 03/23/2017 13:28 ADDENDUM: Voice recognition error: The first sentence of the impression section should read "Essentially diffusion only weighted images demonstrate No acute or subacute infarction. " Electronically Signed   By: Marin Roberts M.D.   On: 03/23/2017 13:28   Result Date: 03/23/2017 CLINICAL DATA:  Seizure, new, nontraumatic common greater than 14 years old. EXAM: MRI HEAD WITHOUT CONTRAST TECHNIQUE: Multiplanar, multiecho pulse sequences of the brain and surrounding structures were obtained without intravenous contrast. COMPARISON:  CTA head and neck 03/22/2017. FINDINGS: Brain: Study is severely degraded by patient motion. Sagittal and axial diffusion-weighted images demonstrate no acute or subacute infarction. Other sequences were not performed. Vascular: Not evaluated due to limited number sequences. Skull and upper cervical spine: Not evaluated patient motion and limited sequences. Sinuses/Orbits: Not evaluated due to patient motion sequences. IMPRESSION: Essentially diffusion only weighted images demonstrate acute or subacute infarction. No seizure focus identified. Electronically Signed: By: Marin Roberts M.D. On: 03/22/2017 15:31   Ct Cerebral Perfusion W Contrast  Result Date: 03/22/2017 EXAM: CT ANGIOGRAPHY HEAD AND NECK CT PERFUSION BRAIN TECHNIQUE: Multidetector CT imaging of the head and neck was performed using the standard protocol during bolus administration of intravenous contrast. Multiplanar CT image reconstructions and MIPs were obtained to evaluate the vascular  anatomy. Carotid stenosis measurements (when applicable) are obtained utilizing NASCET criteria, using the distal internal carotid diameter as the denominator. Multiphase CT imaging of the brain was performed following IV bolus contrast injection. Subsequent parametric perfusion maps were calculated using RAPID software. CONTRAST:  ISOVUE-370 IOPAMIDOL (ISOVUE-370) INJECTION 76% COMPARISON:  None. FINDINGS: CTA NECK FINDINGS Aortic arch: Visualized aortic arch of normal caliber with normal branch pattern. No high-grade stenosis about the origin of the great vessels. Visualized subclavian arteries widely patent. Right carotid system: Right common carotid artery demonstrates scattered atheromatous irregularity without flow-limiting stenosis. There is a severe near occlusive stenosis involving the proximal right  ICA (series 7, image 206). String sign present. Stenosis measures approximately 4 mm in length. Right ICA patent distally to the skull base without additional stenosis, dissection, or occlusion. Left carotid system: Left common carotid artery tortuous proximally. A centric plaque within the mid-distal left common carotid artery without significant stenosis. Calcified plaque about the left carotid bifurcation/proximal left ICA associated stenosis of up to approximately 50% by NASCET criteria. Left ICA patent distally to the skull base without additional stenosis, dissection, or occlusion. Vertebral arteries: Both of the vertebral arteries arise from the subclavian arteries. Right vertebral artery occluded at its origin. Left vertebral artery patent within the neck without stenosis, dissection, or occlusion. Skeleton: Mild exaggeration of the normal cervical lordosis. Trace retrolisthesis of C5 on C6. No other listhesis or malalignment. Skull base intact. Normal C1-2 articulations are preserved in the dens is intact. Vertebral body heights maintained. No acute fracture. No abnormal prevertebral edema. Mild  to moderate degenerate spondylolysis present at C3-4 through C6-7. Other neck: Soft tissues of the neck demonstrate no other acute abnormality. Salivary glands within normal limits. Thyroid normal. No adenopathy. Upper chest: Visualized upper chest demonstrates no acute abnormality. Partially visualized lungs are grossly clear. Review of the MIP images confirms the above findings CTA HEAD FINDINGS Anterior circulation: Petrous right ICA patent proximally. There is a focal severe stenosis at the distal petrous/cavernous junction on the right (series 7, image 121). Extensive atheromatous disease throughout the cavernous/supraclinoid right ICA with moderate to severe diffuse narrowing. Right ICA is patent to the terminus. On the left, the petrous left ICA patent without high-grade stenosis. Extensive atheromatous plaque throughout the cavernous/supraclinoid left ICA with moderate to severe narrowing as well. Left ICA terminus patent. Multifocal moderate to severe stenoses present within the A1 segments bilaterally. Patent anterior communicating artery. Anterior cerebral arteries demonstrate extensive atheromatous irregularity with multifocal severe stenoses, right worse than left, but are patent to their distal aspects. Multifocal moderate to severe proximal and mid right M1 stenoses (series 7, image 92, 90). There are additional multifocal severe proximal right M2 stenoses. Right MCA branches are perfused distally, although demonstrate extensive atheromatous irregularity. Diffuse atheromatous irregularity throughout the left M1 segment with more mild to moderate multifocal narrowing at the mid aspect. Extensive atheromatous irregularity throughout the left MCA branches, which are perfused to their distal aspects. Posterior circulation: Extensive atheromatous irregularity with moderate to severe stenoses involving the mid and distal left V4 segment. Left PICA is patent. Right vertebral artery occluded. There is severe  near occlusive stenosis at the vertebrobasilar junction/proximal basilar artery (series 7, image 135). Scant thready flow seen distally within the basilar artery with multifocal moderate to severe stenoses. Basilar is patent to its distal aspect. Superior cerebral arteries patent bilaterally. Right PCA supplied via the basilar. Predominant fetal type origin of the left PCA supplied via a patent and irregular left P com. Extensive atheromatous irregularity throughout the PCAs bilaterally with multifocal severe stenoses. PCAs are patent to their distal aspects. Venous sinuses: Grossly patent, although not well evaluated due to arterial timing of the contrast bolus Anatomic variants: Fetal type origin of the left PCA with underlying diminutive vertebrobasilar system. No aneurysm. Delayed phase: Not performed. Review of the MIP images confirms the above findings CT Brain Perfusion Findings: CBF (<30%) Volume: 76Natasha Meadusion (Tmax>6.0s) volume63mNatasha 39mNatasha Meadmatch Volume: [id number] Infarction Location:No cerebral infarct by perfusion. Mildly elevated mean transit time in T-max within the right cerebral hemisphere most likely related to noted right ICA stenosis. IMPRESSION: 1. Negative CTA  for emergent large vessel occlusion. No evidence for acute infarct by CT perfusion. 2. 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 3. 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 4. 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 Findings are associated with near occlusive stenosis at the proximal basilar artery, with markedly attenuated with irregular thready flow distally. 5. No acute abnormality within the cervical spine.  No fracture. Critical Value/emergent results were called by telephone at the time of interpretation on 03/22/2017 at 5:50 am to Dr. Ross Marcus ;  [facility] , who verbally acknowledged these results. Electronically Signed   By: Rise Mu M.D.   On: 03/22/2017 06:36   Ct Head Code Stroke Wo Contrast  Result Date: 03/22/2017 CLINICAL DATA:  Code stroke. Initial evaluation for acute fall, seizure activity. EXAM: CT HEAD WITHOUT CONTRAST TECHNIQUE: Contiguous axial images were obtained from the base of the skull through the vertex without intravenous contrast. COMPARISON:  None. FINDINGS: Brain: Generalized age-related cerebral atrophy. Moderate chronic small vessel ischemic disease. No acute intracranial hemorrhage. No acute large vessel territory infarct. No mass lesion, midline shift or mass effect. Diffuse ventricular prominence related global parenchymal volume loss of hydrocephalus. No extra-axial fluid collection. Vascular: No asymmetric hyperdense vessel. Scattered vascular calcifications noted within the carotid siphons. Skull: Scalp soft tissues and calvarium within normal limits. Remote posttraumatic defect noted at the right zygomatic arch. Sinuses/Orbits: Globes and orbital soft tissues within normal limits. Patient status post lens extraction bilaterally. Scattered mucosal thickening within the maxillary sinuses and ethmoidal air cells. Paranasal sinuses are otherwise clear. No mastoid effusion. Other: None. ASPECTS [facility] Stroke Program Early CT Score) - Ganglionic level infarction (caudate, lentiform nuclei, internal capsule, insula, M1-M3 cortex): 7 - Supraganglionic infarction (M4-M6 cortex): 3 Total score (0-10 with 10 being normal): 10 IMPRESSION: 1. No acute intracranial abnormality identified. 2. ASPECTS is 10. 3. Moderate cerebral atrophy with chronic small vessel ischemic disease. These results were communicated to [facility] at 5:36 amon 3/20/2019by text page via the [facility] messaging system. Electronically Signed   By: Rise Mu M.D.   On: 03/22/2017 05:39    Microbiology: Recent Results (from the  past 240 hour(s))  MRSA PCR Screening     Status: None   Collection Time: 03/22/17  6:45 PM  Result Value Ref Range Status   MRSA by PCR NEGATIVE NEGATIVE Final    Comment:        The GeneXpert MRSA Assay (FDA approved for NASAL specimens only), is one component of a comprehensive MRSA colonization surveillance program. It is not intended to diagnose MRSA infection nor to guide or monitor treatment for MRSA infections. Performed at [facility], [address], [place], [place] [address]      Labs: Basic Metabolic Panel: Recent Labs  Lab 03/22/17 0508 03/22/17 0533 03/23/17 0730 03/23/17 1422  NA 137 140 137  --   K 4.2 4.3 5.3* 3.9  CL 104 106 106  --   CO2 17*  --  21*  --   GLUCOSE 195* 195* 92  --   BUN 16 20 12   --   CREATININE 1.45* 1.30* 1.19  --   CALCIUM 8.7*  --  8.5*  --    Liver Function Tests: Recent Labs  Lab 03/22/17 0508  AST 34  ALT 19  ALKPHOS 73  BILITOT <0.1*  PROT 6.3*  ALBUMIN 3.5   No results for input(s): LIPASE, AMYLASE in the last 168 hours. No results for input(s): AMMONIA in the last 168 hours. CBC: Recent Labs  Lab 03/22/17 0508 03/22/17 0533 03/23/17 0730  WBC 8.2  --  7.0  NEUTROABS 4.7  --   --   HGB 12.4* 13.3 13.0  HCT 38.5* 39.0 37.9*  MCV 84.2  --  83.1  PLT 176  --  147*   Cardiac Enzymes: No results for input(s): CKTOTAL, CKMB, CKMBINDEX, TROPONINI in the last 168 hours. BNP: BNP (last 3 results) No results for input(s): BNP in the last 8760 hours.  ProBNP (last 3 results) No results for input(s): PROBNP in the last 8760 hours.  CBG: Recent Labs  Lab 03/26/17 0648 03/26/17 1144 03/26/17 1713 03/26/17 2213 03/27/17 0642  GLUCAP 127* 134* 107* 175* 120*       Signed:  Darlin Drop, MD [facility] 03/27/2017, 11:45 AM

## 2017-03-27 NOTE — Clinical Social Work Placement (Addendum)
Nurse to call report to [phone], Room 125A    CLINICAL SOCIAL WORK PLACEMENT  NOTE  Date:  03/27/2017  Patient Details  Name: Frank Key MRN: [id number] Date of Birth: [birth year]  Clinical Social Work is seeking post-discharge placement for this patient at the Skilled  Nursing Facility level of care (*CSW will initial, date and re-position this form in  chart as items are completed):  Yes   Patient/family provided with [place] Clinical Social Work Department's list of facilities offering this level of care within the geographic area requested by the patient (or if unable, by the patient's family).  Yes   Patient/family informed of their freedom to choose among providers that offer the needed level of care, that participate in Medicare, Medicaid or managed care program needed by the patient, have an available bed and are willing to accept the patient.  Yes   Patient/family informed of [facility]'s ownership interest in [facility] and [facility], as well as of the fact that they are under no obligation to receive care at these facilities.  PASRR submitted to EDS on       PASRR number received on 03/24/17     Existing PASRR number confirmed on       FL2 transmitted to all facilities in geographic area requested by pt/family on 03/23/17     FL2 transmitted to all facilities within larger geographic area on       Patient informed that his/her managed care company has contracts with or will negotiate with certain facilities, including the following:        Yes   Patient/family informed of bed offers received.  Patient chooses bed at [facility]     Physician recommends and patient chooses bed at      Patient to be transferred [facility] on 03/27/17.  Patient to be transferred to facility by Family car     Patient family notified on 03/27/17 of transfer.  Name of family member notified:  Tyra     PHYSICIAN       Additional Comment:     _______________________________________________ Baldemar Lenis, LCSW 03/27/2017, 12:06 PM

## 2017-03-27 NOTE — Care Management Note (Signed)
Case Management Note  Patient Details  Name: Frank Key MRN: [id number] Date of Birth: Oct 13, 1940  Subjective/Objective:                    Action/Plan: Pt discharging to [facility] today. CM singing off.    Expected Discharge Date:  03/27/17               Expected Discharge Plan:  Skilled Nursing Facility  In-House Referral:  Clinical Social Work  Discharge planning Services     Post Acute Care Choice:    Choice offered to:     DME Arranged:    DME Agency:     HH Arranged:    HH Agency:     Status of Service:  Completed, signed off  If discussed at [employer], dates discussed:    Additional Comments:  Kermit Balo, RN 03/27/2017, 12:04 PM

## 2017-04-11 ENCOUNTER — Ambulatory Visit (INDEPENDENT_AMBULATORY_CARE_PROVIDER_SITE_OTHER): Payer: Medicare Other | Admitting: Physician Assistant

## 2017-04-11 ENCOUNTER — Encounter: Payer: Self-pay | Admitting: Physician Assistant

## 2017-04-11 VITALS — BP 178/84 | HR 63 | Ht 73.0 in

## 2017-04-11 DIAGNOSIS — I5033 Acute on chronic diastolic (congestive) heart failure: Secondary | ICD-10-CM

## 2017-04-11 DIAGNOSIS — G309 Alzheimer's disease, unspecified: Secondary | ICD-10-CM

## 2017-04-11 DIAGNOSIS — I10 Essential (primary) hypertension: Secondary | ICD-10-CM

## 2017-04-11 DIAGNOSIS — I441 Atrioventricular block, second degree: Secondary | ICD-10-CM | POA: Diagnosis not present

## 2017-04-11 DIAGNOSIS — F0281 Dementia in other diseases classified elsewhere with behavioral disturbance: Secondary | ICD-10-CM

## 2017-04-11 DIAGNOSIS — E785 Hyperlipidemia, unspecified: Secondary | ICD-10-CM

## 2017-04-11 MED ORDER — FUROSEMIDE 20 MG PO TABS: 20.0000 mg | ORAL_TABLET | Freq: Every day | ORAL | 3 refills | Status: AC

## 2017-04-11 NOTE — Progress Notes (Signed)
Cardiology Office Note    Date:  04/11/2017   ID:  Frank Key, DOB 1940/12/15, MRN [id number]  PCP:  Eloisa Northern, MD  Cardiologist: Dr. Rennis Golden  Chief Complaint  Patient presents with  . Follow-up    post hospital. Seen for Dr. Rennis Golden. LE edema    History of Present Illness:  Frank Key is a 77 y.o. male with past medical history of bipolar affective disorder, Alzheimer's dementia with behavioral disturbance, first-degree AV block, hypertension, hyperlipidemia, insomnia, history of schizophrenia, CVA and seizure.  Patient was recently seen in the hospital in March 2019 after he presented with altered mental status and a possible seizure.  He was found to have second-degree AV block on the monitor.  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  His heart rate was in the 40s and 50s.  He denied any chest pain.  Given the asymptomatic nature, he did not meet the criteria for pacemaker placement.  It was recommended he avoid any AV nodal blocking agent.  Echocardiogram obtained on 03/24/2017 showed EF 65-70%, severe LVH, mild aortic regurgitation, moderately dilated left atrium, PA peak pressure 34 mmHg.  Given the severe LVH, there was some consideration of possible infiltrative heart disease, however no further workup was recommended.  Patient is currently living in [facility].  He does not do much activity.  He denies any recent chest pain or shortness of breath.  His daughter who was with him during today's visit is the healthcare power of attorney.  She is trying to find him a primary care provider in the local area.  He denies any recent dizziness, blurred vision or feeling of passing out.  I will hold off on additional workup.  He has 3+ lower extremity pitting edema on physical exam, per daughter, this has been worsening for the past 6 weeks.  I have started him on 20 mg daily of Lasix.  His blood pressure is also unusually high today,  if his blood pressure remain high on follow-up, we can consider increasing his blood pressure medication.  Majority of the fluid seems to be his legs, his lung is quite clear.  Given lack of symptom for his second-degree AV block, there is no current plan for pacemaker.     Past Medical History:  Diagnosis Date  . Alzheimer's dementia with behavioral disturbance   . Anxiety   . Bipolar affective disorder (HCC)   . BPH (benign prostatic hyperplasia)   . Depression   . First degree AV block   . Glaucoma   . High cholesterol   . Hypertension   . Insomnia   . Schizophrenia (HCC)   . Seizures (HCC) ~ 06/2016; ?03/21/2017  . Stroke [facility]) ?2017  . Type 2 diabetes, diet controlled (HCC)     Past Surgical History:  Procedure Laterality Date  . APPENDECTOMY    . CHOLECYSTECTOMY    . EYE SURGERY    . GLAUCOMA SURGERY Bilateral   . REFRACTIVE SURGERY Bilateral     Current Medications: Outpatient Medications Prior to Visit  Medication Sig Dispense Refill  . amLODipine (NORVASC) 10 MG tablet Take 10 mg by mouth daily.    . ARIPiprazole (ABILIFY) 10 MG tablet Take 10 mg by mouth daily.    Marland Kitchen aspirin EC 81 MG tablet Take 81 mg by mouth every morning.    Marland Kitchen atorvastatin (LIPITOR) 20 MG tablet Take 20 mg by mouth daily.    Marland Kitchen  bisacodyl (DULCOLAX) 10 MG suppository Place 10 mg rectally daily as needed for moderate constipation.    . brimonidine (ALPHAGAN) 0.2 % ophthalmic solution Place 1 drop into the right eye 2 (two) times daily.    . brinzolamide (AZOPT) 1 % ophthalmic suspension Place 1 drop into both eyes 2 (two) times daily.    Marland Kitchen donepezil (ARICEPT) 5 MG tablet Take 5 mg by mouth at bedtime.    . finasteride (PROSCAR) 5 MG tablet Take 5 mg by mouth daily.    Marland Kitchen levETIRAcetam (KEPPRA) 100 MG/ML solution Take 7.5 mLs (750 mg total) by mouth every 12 (twelve) hours. 473 mL 0  . Melatonin 3 MG TABS Take 6 mg by mouth at bedtime.    Marland Kitchen neomycin-bacitracin-polymyxin (NEOSPORIN) OINT Apply 1  application topically daily as needed for irritation or wound care.    . Skin Protectants, Misc. (MINERIN) CREA Apply 1 application topically at bedtime.    . Syringe, Disposable, (TUBERCULIN SYRINGE 1CC) 1 ML MISC by Does not apply route. Use as directed    . tamsulosin (FLOMAX) 0.4 MG CAPS capsule Take 0.4 mg by mouth daily after supper.    . Travoprost, BAK Free, (TRAVATAN) 0.004 % SOLN ophthalmic solution Place 1 drop into both eyes at bedtime.    . traZODone (DESYREL) 150 MG tablet Take 75 mg by mouth at bedtime.    . Valproate Sodium (DEPAKENE) 250 MG/5ML SOLN solution Take 625 mg by mouth 2 (two) times daily.     No facility-administered medications prior to visit.      Allergies:   Penicillins   Social History   Socioeconomic History  . Marital status: Divorced    Spouse name: Not on file  . Number of children: Not on file  . Years of education: Not on file  . Highest education level: Not on file  Occupational History  . Not on file  Social Needs  . Financial resource strain: Not on file  . Food insecurity:    Worry: Not on file    Inability: Not on file  . Transportation needs:    Medical: Not on file    Non-medical: Not on file  Tobacco Use  . Smoking status: Former Smoker    Types: Cigarettes  . Smokeless tobacco: Never Used  . Tobacco comment: 03/22/2017 "nothing since the ealy 1990s"  Substance and Sexual Activity  . Alcohol use: No    Frequency: Never    Comment: 03/22/2017 "nothing in the last 10 years"  . Drug use: Yes    Types: "Crack" cocaine, Marijuana    Comment: 03/22/2017 "nothing in the last 10 years"  . Sexual activity: Not on file  Lifestyle  . Physical activity:    Days per week: Not on file    Minutes per session: Not on file  . Stress: Not on file  Relationships  . Social connections:    Talks on phone: Not on file    Gets together: Not on file    Attends religious service: Not on file    Active member of club or organization: Not on file     Attends meetings of clubs or organizations: Not on file    Relationship status: Not on file  Other Topics Concern  . Not on file  Social History Narrative  . Not on file     Family History:  The patient's family history is not on file.   ROS:   Please see the history of present illness.  ROS All other systems reviewed and are negative.   PHYSICAL EXAM:   VS:  BP (!) 178/84   Pulse 63   Ht 6\' 1"  (1.854 m)   SpO2 98%   BMI 25.89 kg/m    GEN: Well nourished, well developed, in no acute distress  HEENT: normal  Neck: no JVD, carotid bruits, or masses Cardiac: RRR; no murmurs, rubs, or gallops,no edema  Respiratory:  clear to auscultation bilaterally, normal work of breathing GI: soft, nontender, nondistended, + BS MS: no deformity or atrophy  Skin: warm and dry, no rash Neuro:  Alert and Oriented x 3, Strength and sensation are intact Psych: euthymic mood, full affect  Wt Readings from Last 3 Encounters:  03/27/17 196 lb 3.4 oz (89 kg)      Studies/Labs Reviewed:   EKG:  EKG is not ordered today.    Recent Labs: 03/22/2017: ALT 19 03/23/2017: BUN 12; Creatinine, Ser 1.19; Hemoglobin 13.0; Platelets 147; Potassium 3.9; Sodium 137   Lipid Panel No results found for: CHOL, TRIG, HDL, CHOLHDL, VLDL, LDLCALC, LDLDIRECT  Additional studies/ records that were reviewed today include:   Echo 03/24/2017 LV EF: 65% -   70%  Study Conclusions  - Left ventricle: The cavity size was normal. Wall thickness was   increased in a pattern of severe LVH. Systolic function was   vigorous. The estimated ejection fraction was in the range of 65%   to 70%. Wall motion was normal; there were no regional wall   motion abnormalities. The study is not technically sufficient to   allow evaluation of LV diastolic function. - Aortic valve: Trileaflet; moderately thickened, moderately   calcified leaflets. There was mild regurgitation. - Mitral valve: Moderately calcified annulus. -  Left atrium: The atrium was moderately dilated. - Pulmonary arteries: Systolic pressure was mildly increased. PA   peak pressure: 34 mm Hg (S).  Impressions:  - No cardiac source of emboli was indentified.   ASSESSMENT:    1. Acute on chronic diastolic heart failure (HCC)   2. Alzheimer's dementia with behavioral disturbance, unspecified timing of dementia onset   3. Second degree AV block   4. Essential hypertension   5. Hyperlipidemia, unspecified hyperlipidemia type      PLAN:  In order of problems listed above:  1. Acute on chronic diastolic heart failure: He has at least 2+ and maybe 3+ pitting edema in bilateral lower extremity.  According to the daughter, this has been worsening for the past 6 weeks.  We will start Lasix at 20 mg daily.  He will need 1 week basic metabolic panel to see if he will need potassium supplement as well.   2. Second-degree AV block: He has both type I and a type II Mobitz heart block during the recent hospitalization.  Given the asymptomatic nature, he does not need a pacemaker at this time.  3. Hypertension: Blood pressure related to today's visit, we will need to reassess blood pressure on follow-up, and potentially uptitrate blood pressure medications if needed.  Note he should not be on any AV nodal blocking agent given the second-degree AV block.  4. Hyperlipidemia: On Lipitor 20 mg daily    Medication Adjustments/Labs and Tests Ordered: Current medicines are reviewed at length with the patient today.  Concerns regarding medicines are outlined above.  Medication changes, Labs and Tests ordered today are listed in the Patient Instructions below. Patient Instructions  Medication Instructions:  START lasix 20mg  Take 1 tablet once a day  Labwork: Your physician recommends that you return for lab work in: 1 week BMP  Testing/Procedures: None   Follow-Up: Your physician recommends that you schedule a follow-up appointment in: 2-3 months  with Dr Rennis Golden.  Any Other Special Instructions Will Be Listed Below (If Applicable).  If you need a refill on your cardiac medications before your next appointment, please call your pharmacy.     Ramond Dial, [place]  04/11/2017 11:11 PM    [facility] [address] [place], [place], [place]  [address] Phone: [phone]; Fax: [phone]

## 2017-04-11 NOTE — Patient Instructions (Addendum)
Medication Instructions:  START lasix 20mg  Take 1 tablet once a day   Labwork: Your physician recommends that you return for lab work in: 1 week BMP  Testing/Procedures: None   Follow-Up: Your physician recommends that you schedule a follow-up appointment in: 2-3 months with Dr Rennis Golden.  Any Other Special Instructions Will Be Listed Below (If Applicable).  If you need a refill on your cardiac medications before your next appointment, please call your pharmacy.

## 2017-07-27 ENCOUNTER — Ambulatory Visit: Payer: Medicare Other | Admitting: Internal Medicine

## 2017-07-28 ENCOUNTER — Encounter: Payer: Self-pay | Admitting: *Deleted

## 2017-09-03 DEATH — deceased

## 2019-04-10 IMAGING — MR MR HEAD W/O CM
5 series · 48 of 48 positions shown · non-contrast
Comparison: CTA head and neck 03/22/2017.

ADDENDUM:
Voice recognition error: The first sentence of the impression
section should read "Essentially diffusion only weighted images
demonstrate No acute or subacute infarction.. "
CLINICAL DATA: Seizure, new, nontraumatic common greater than 40
years old.

EXAM:
MRI HEAD WITHOUT CONTRAST
TECHNIQUE: Multiplanar, multiecho pulse sequences of the brain and surrounding
structures were obtained without intravenous contrast.

[Series 6: DWI · axial · 3.0mm · 1.09mm/px · z∈[-86,+76]mm · 17 of 110 slices shown (1 of 4)]
[im 1/110]
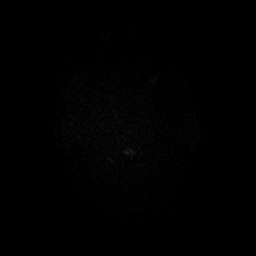
[im 7/110]
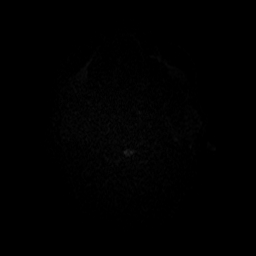
[im 14/110]
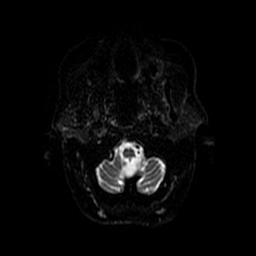
[im 21/110]
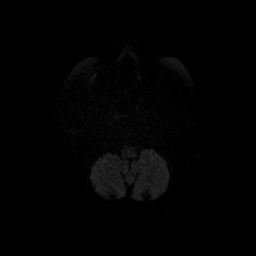
[im 28/110]
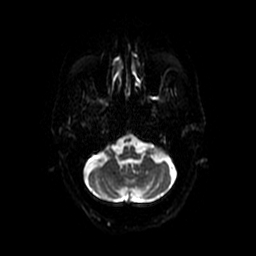
[im 35/110]
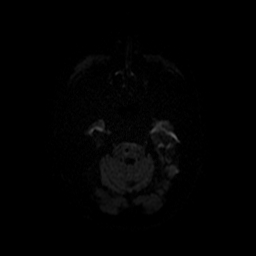
[im 41/110]
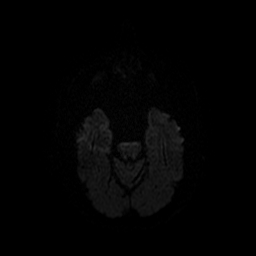
[im 48/110]
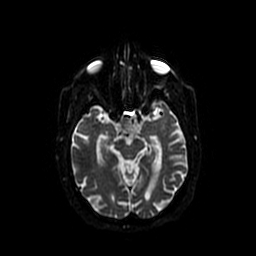
[im 55/110]
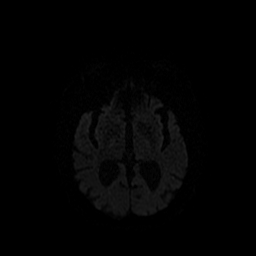
[im 62/110]
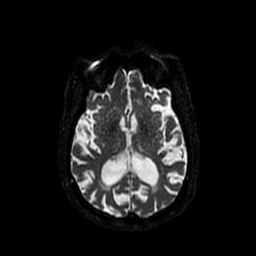
[im 69/110]
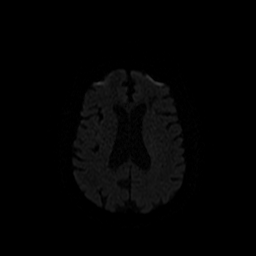
[im 75/110]
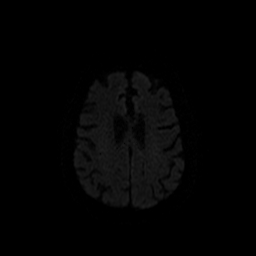
[im 82/110]
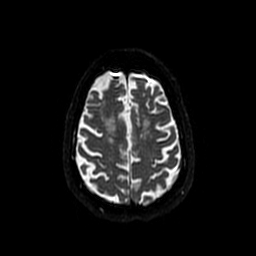
[im 89/110]
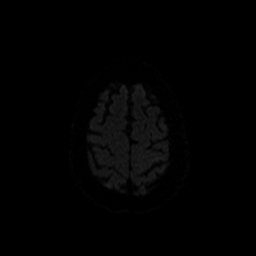
[im 96/110]
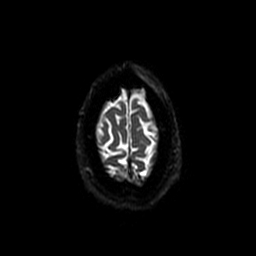
[im 103/110]
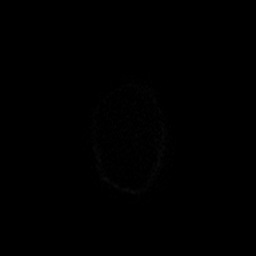
[im 110/110]
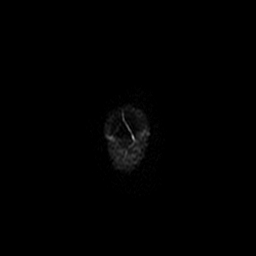

[Series 7: DWI · coronal · 5.0mm · 1.09mm/px · 12 of 74 slices shown (2 of 4)]
[im 1/74]
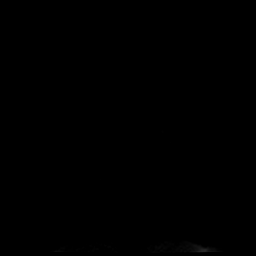
[im 7/74]
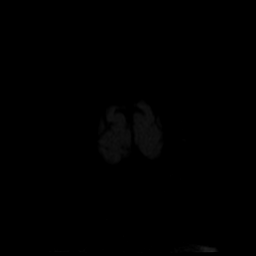
[im 14/74]
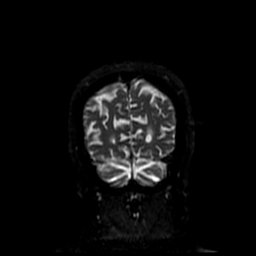
[im 20/74]
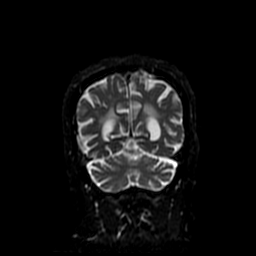
[im 27/74]
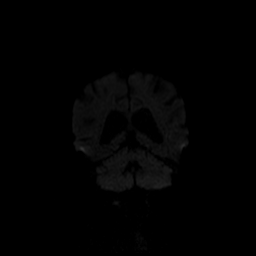
[im 34/74]
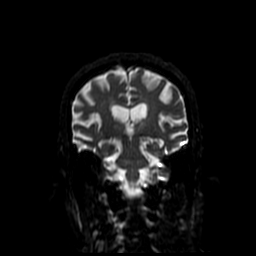
[im 40/74]
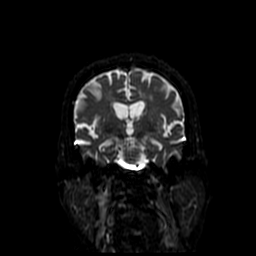
[im 47/74]
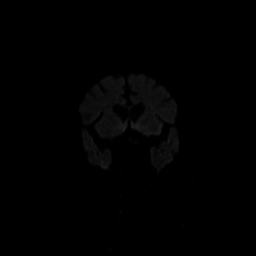
[im 54/74]
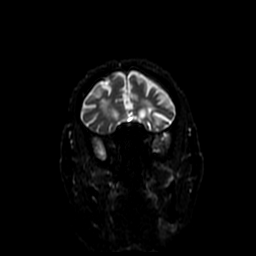
[im 60/74]
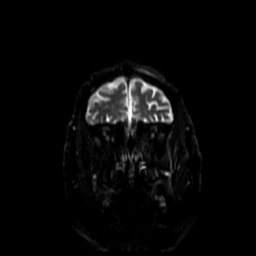
[im 67/74]
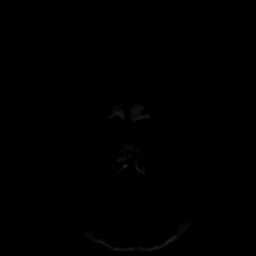
[im 74/74]
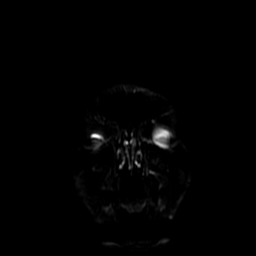

[Series 8: T1 · sagittal · 5.0mm · 0.47mm/px · 4 of 26 slices shown]
[im 1/26]
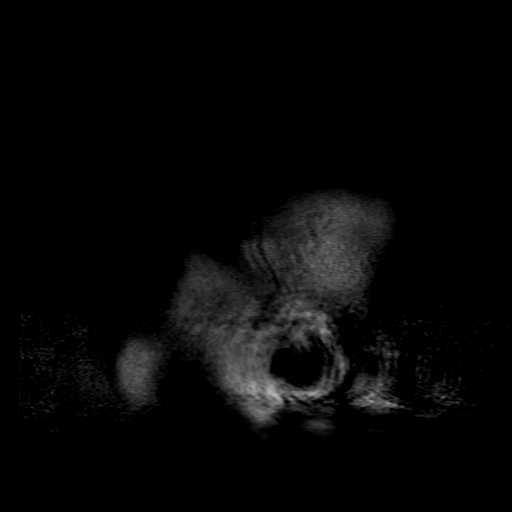
[im 9/26]
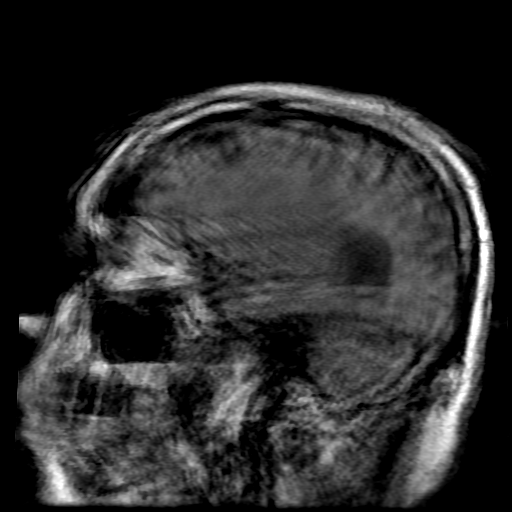
[im 17/26]
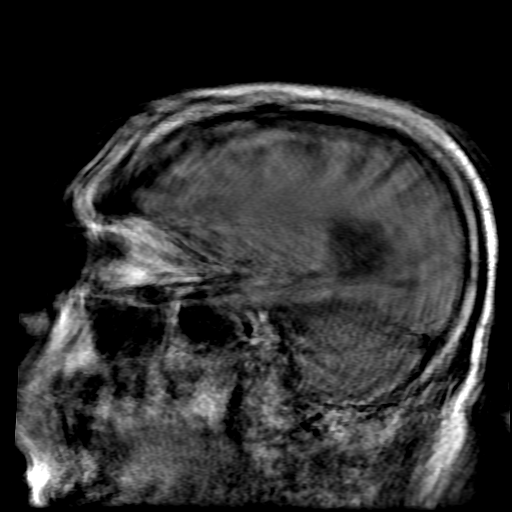
[im 26/26]
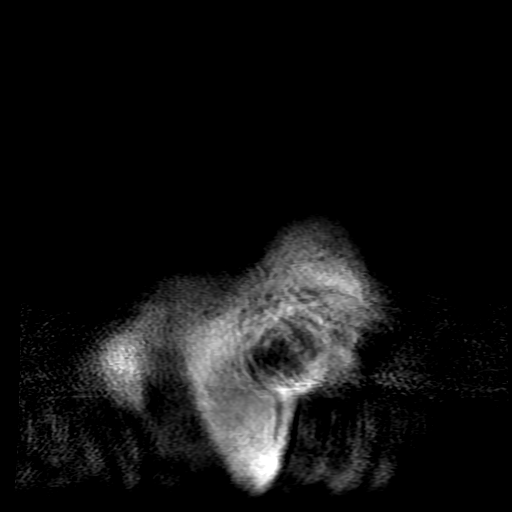

[Series 600: DWI · axial · 3.0mm · 1.09mm/px · z∈[-86,+76]mm · 9 of 55 slices shown (3 of 4)]
[im 1/55]
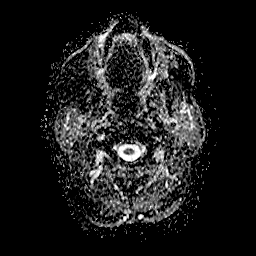
[im 7/55]
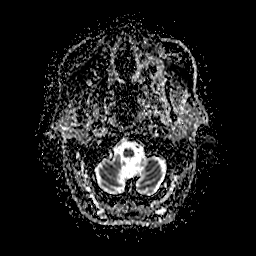
[im 14/55]
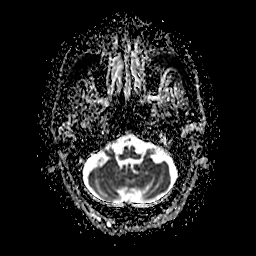
[im 21/55]
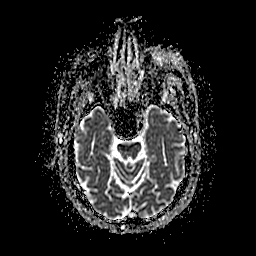
[im 28/55]
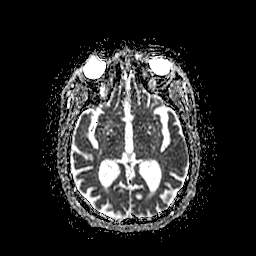
[im 34/55]
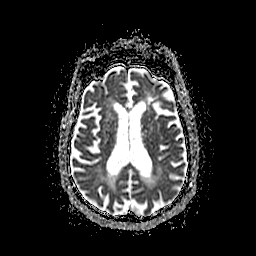
[im 41/55]
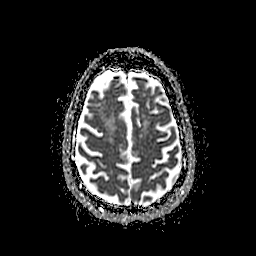
[im 48/55]
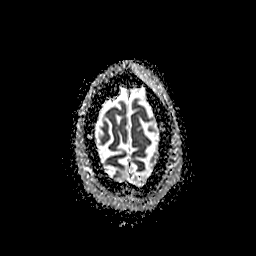
[im 55/55]
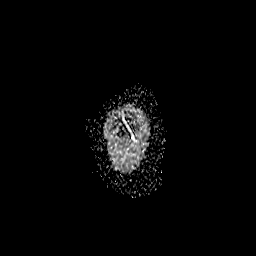

[Series 700: DWI · coronal · 5.0mm · 1.09mm/px · 6 of 37 slices shown (4 of 4)]
[im 1/37]
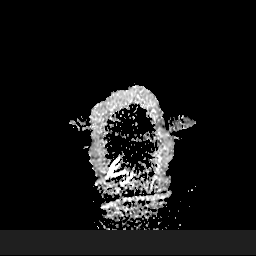
[im 8/37]
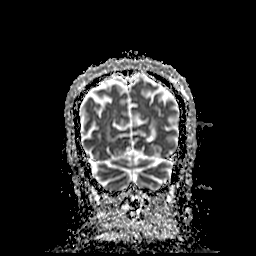
[im 15/37]
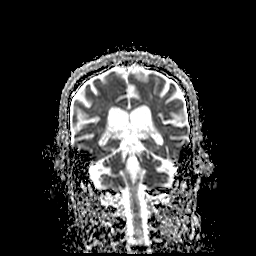
[im 22/37]
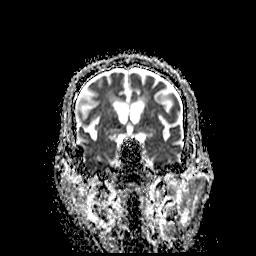
[im 29/37]
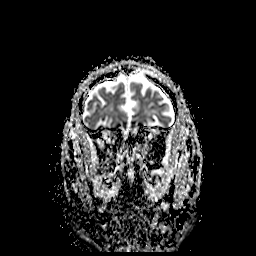
[im 37/37]
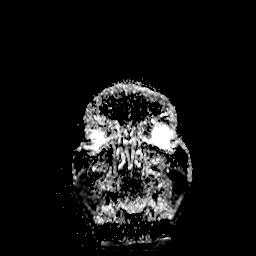

[48 of 48 positions shown; findings below may reference images not displayed]

FINDINGS: Brain: Study is severely degraded by patient motion. Sagittal and
axial diffusion-weighted images demonstrate no acute or subacute
infarction. Other sequences were not performed.

Vascular: Not evaluated due to limited number sequences.

Skull and upper cervical spine: Not evaluated patient motion and
limited sequences.

Sinuses/Orbits: Not evaluated due to patient motion sequences.
IMPRESSION: Essentially diffusion only weighted images demonstrate acute or
subacute infarction. No seizure focus identified.

## 2019-04-10 IMAGING — CT CT HEAD CODE STROKE
4 series · 16 of 47 positions shown, 18 images · non-contrast
Comparison: None.

CLINICAL DATA: Code stroke. Initial evaluation for acute fall,
seizure activity.

EXAM:
CT HEAD WITHOUT CONTRAST
TECHNIQUE: Contiguous axial images were obtained from the base of the skull
through the vertex without intravenous contrast.

[Series 3: head wo · axial · 0.44mm/px · z∈[-104,+20]mm · 7 of 35 slices shown, 9 images]
[im 5/35  brain]
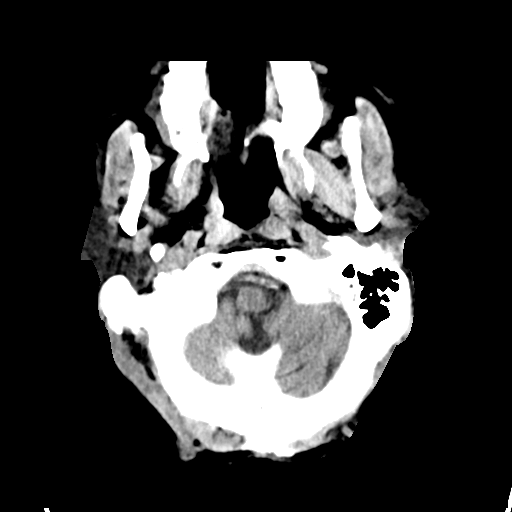
[im 5/35  bone]
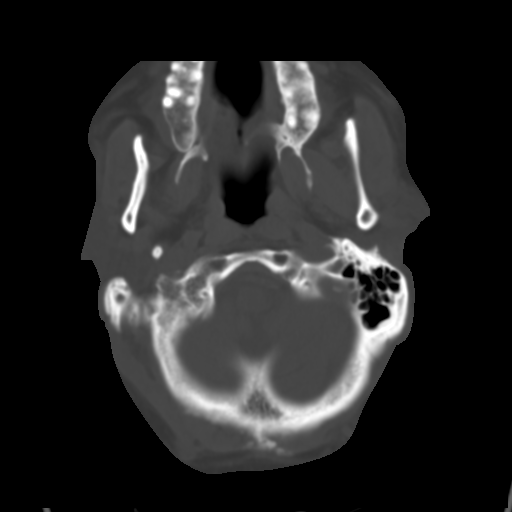
[im 9/35  brain]
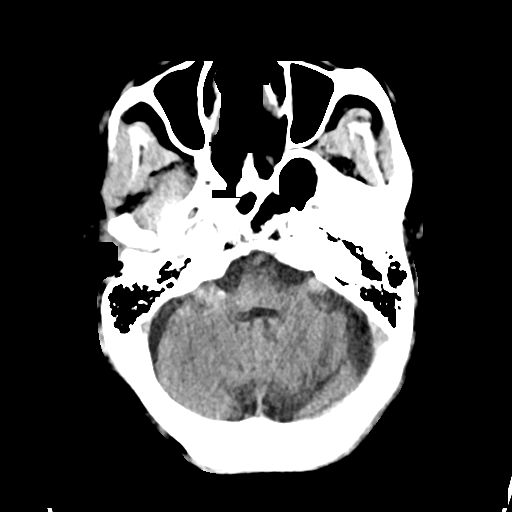
[im 13/35  brain]
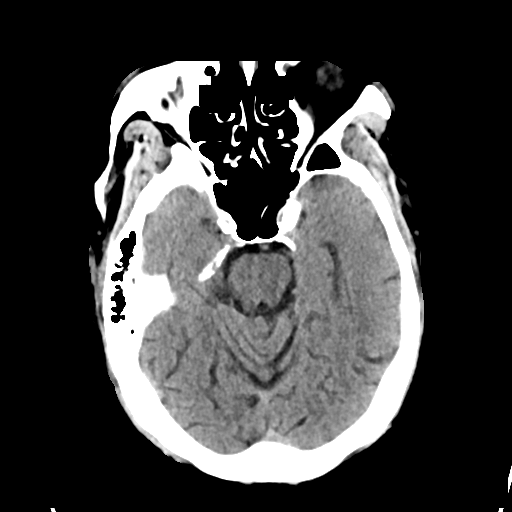
[im 18/35  brain]
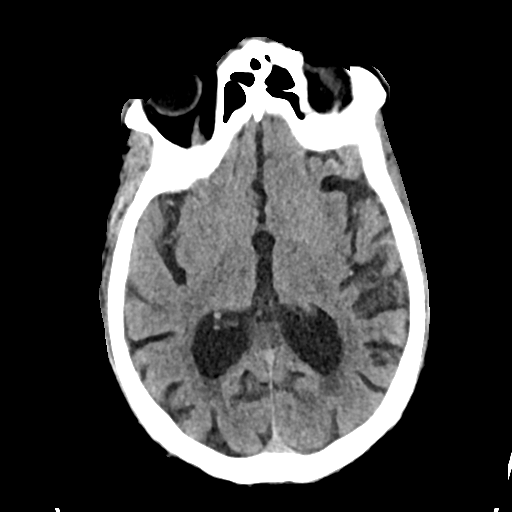
[im 22/35  brain]
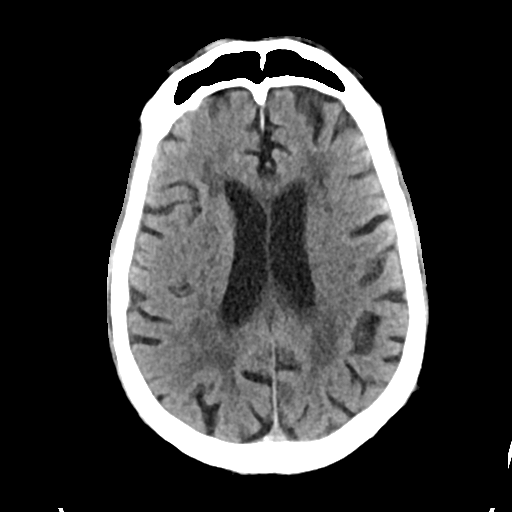
[im 22/35  bone]
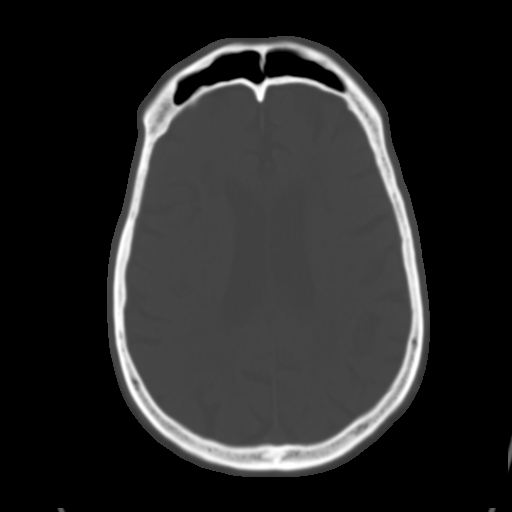
[im 26/35  brain]
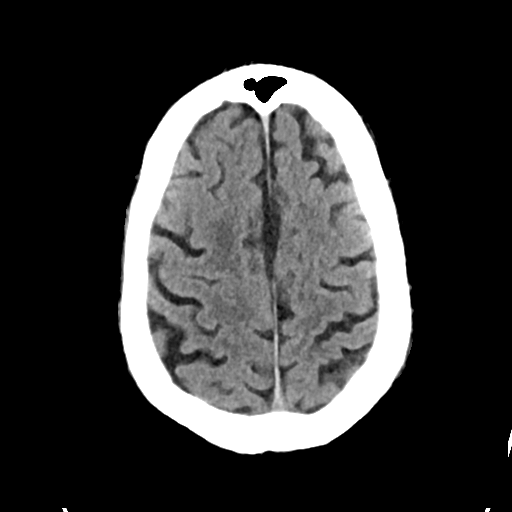
[im 30/35  brain]
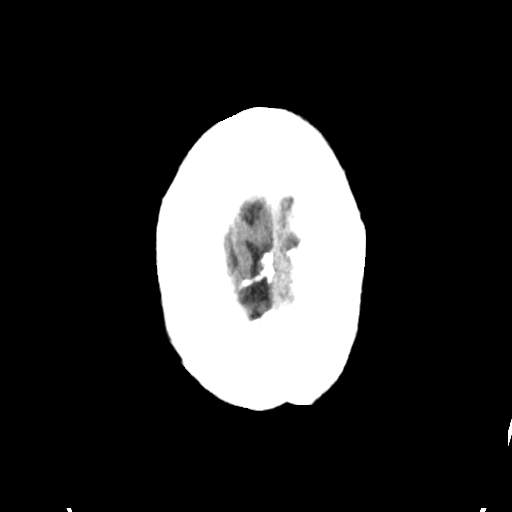

[Series 4: head bone · axial · 0.44mm/px · z∈[-108,-74]mm · 3 of 87 slices shown]
[im 9/87  bone]
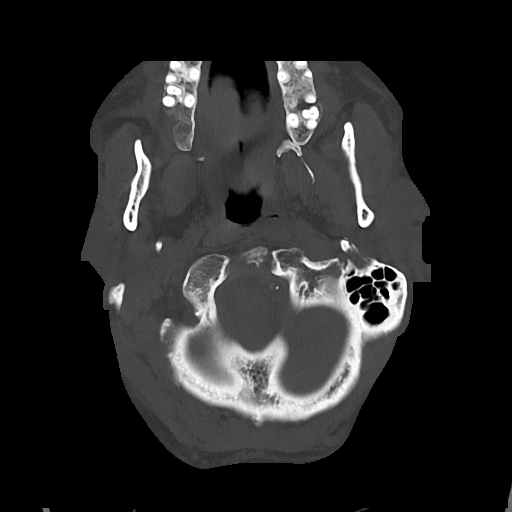
[im 18/87  bone]
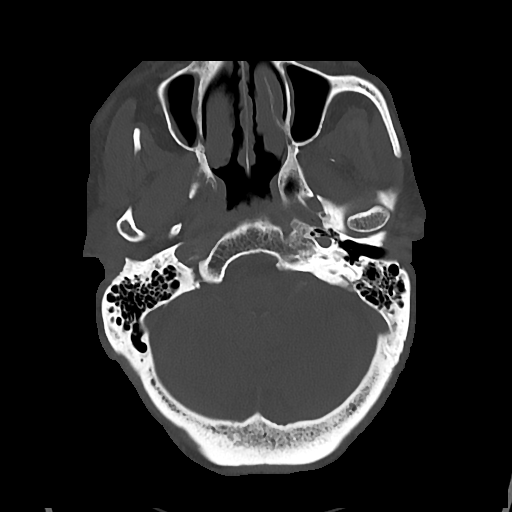
[im 26/87  bone]
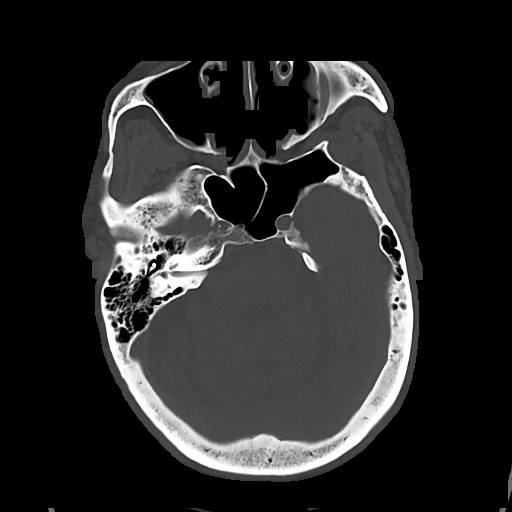

[Series 5: cor soft · coronal · 0.34mm/px · 3 of 70 slices shown]
[im 24/70  brain]
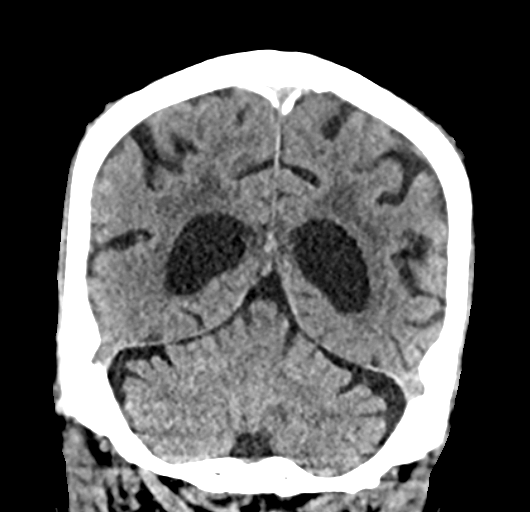
[im 31/70  brain]
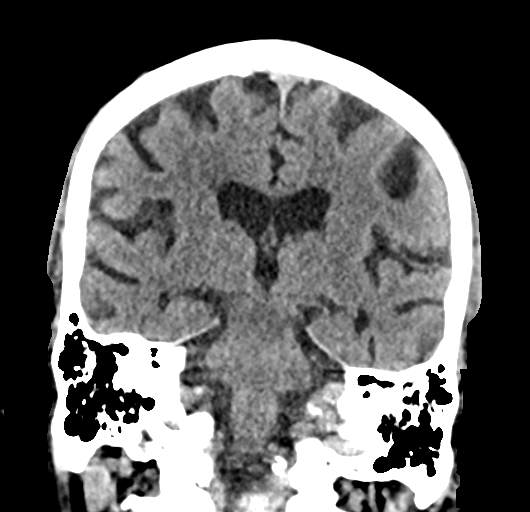
[im 39/70  brain]
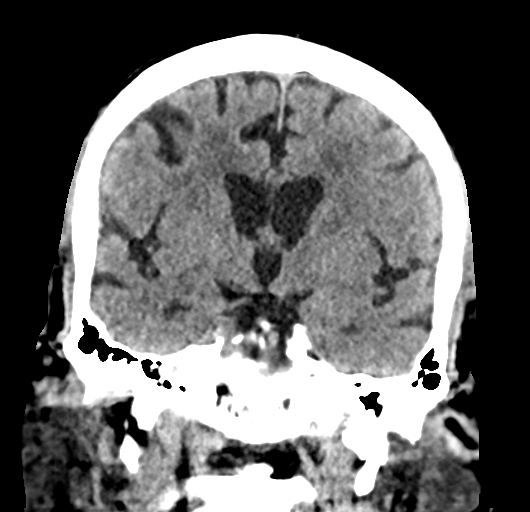

[Series 6: sag soft · sagittal · 0.34mm/px · 3 of 57 slices shown]
[im 19/57  brain]
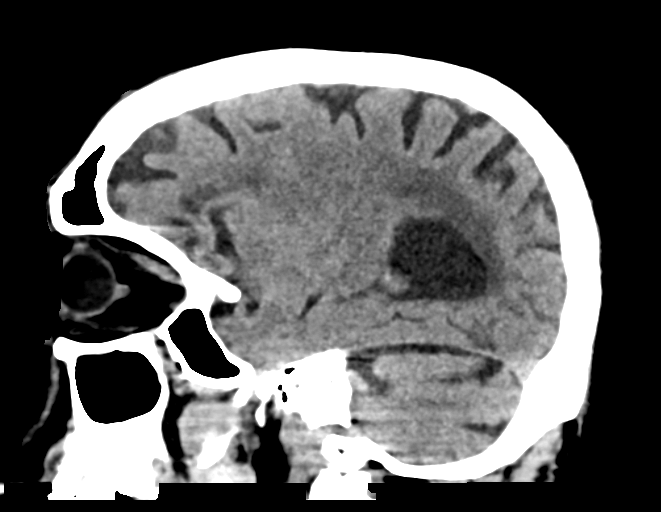
[im 29/57  brain]
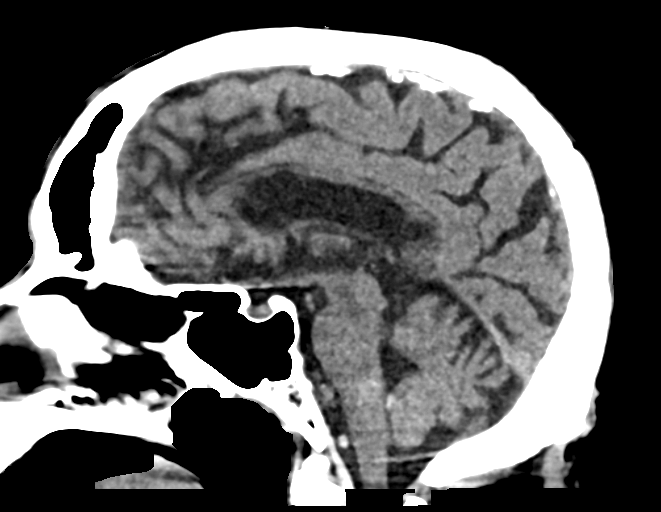
[im 38/57  brain]
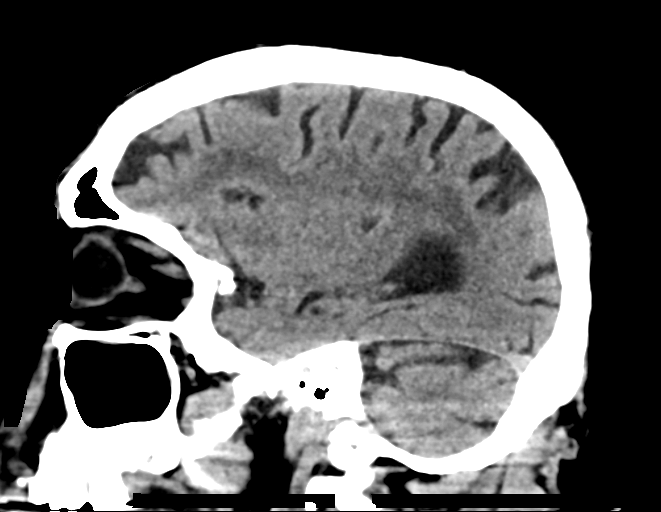

[16 of 47 positions shown; findings below may reference images not displayed]

FINDINGS: Brain: Generalized age-related cerebral atrophy. Moderate chronic
small vessel ischemic disease. No acute intracranial hemorrhage. No
acute large vessel territory infarct. No mass lesion, midline shift
or mass effect. Diffuse ventricular prominence related global
parenchymal volume loss of hydrocephalus. No extra-axial fluid
collection.

Vascular: No asymmetric hyperdense vessel. Scattered vascular
calcifications noted within the carotid siphons.

Skull: Scalp soft tissues and calvarium within normal limits. Remote
posttraumatic defect noted at the right zygomatic arch.

Sinuses/Orbits: Globes and orbital soft tissues within normal
limits. Patient status post lens extraction bilaterally. Scattered
mucosal thickening within the maxillary sinuses and ethmoidal air
cells. Paranasal sinuses are otherwise clear. No mastoid effusion.

Other: None.

ASPECTS (Alberta Stroke Program Early CT Score)

- Ganglionic level infarction (caudate, lentiform nuclei, internal
capsule, insula, M1-M3 cortex): 7

- Supraganglionic infarction (M4-M6 cortex): 3

Total score (0-10 with 10 being normal): 10
IMPRESSION: 1. No acute intracranial abnormality identified.
2. ASPECTS is 10.
3. Moderate cerebral atrophy with chronic small vessel ischemic
disease.

These results were communicated to Ana Maria at [DATE] Djukanovic
03/22/2017by text page via the [employer] messaging system.

## 2019-04-10 IMAGING — CR DG CHEST 1V
1 series · 1 of 1 positions shown · non-contrast
Comparison: None.

CLINICAL DATA: First degree AV block.

EXAM:
CHEST  1 VIEW

[chest ap]
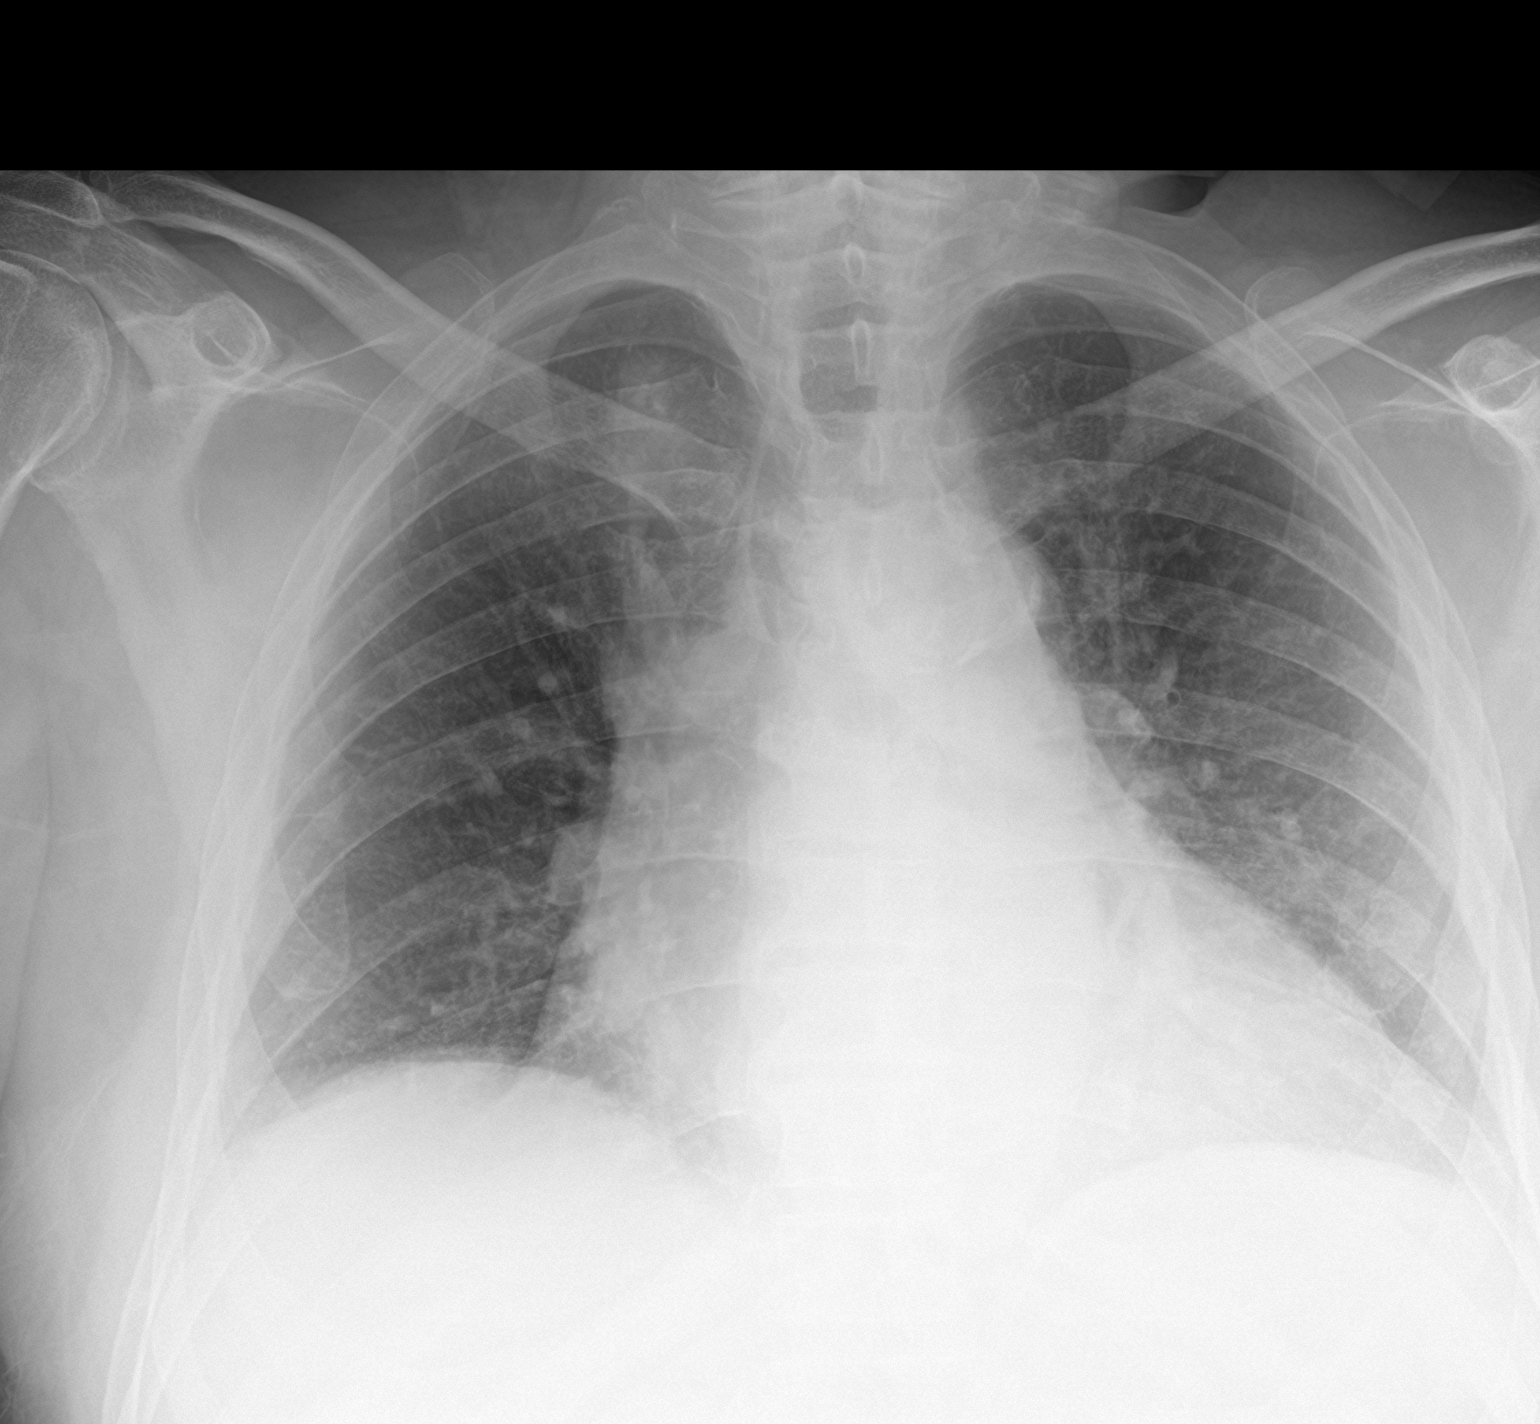

[1 of 1 positions shown; findings below may reference images not displayed]

FINDINGS: Cardiomegaly. Aortic atherosclerosis. Possible pulmonary venous
hypertension but no edema. No effusions. No focal lesion. No
significant bone finding.
IMPRESSION: Cardiomegaly. Aortic atherosclerosis. Possible venous hypertension
without edema.
